# Patient Record
Sex: Male | Born: 1990 | Race: White | Hispanic: No | Marital: Single | Smoking: Current every day smoker
Health system: Southern US, Community
[De-identification: ages and names within clinical notes are randomized; demographics above are authoritative.]

## PROBLEM LIST (undated history)

## (undated) DIAGNOSIS — F419 Anxiety disorder, unspecified: Secondary | ICD-10-CM

## (undated) DIAGNOSIS — M545 Low back pain, unspecified: Secondary | ICD-10-CM

## (undated) DIAGNOSIS — J309 Allergic rhinitis, unspecified: Secondary | ICD-10-CM

## (undated) DIAGNOSIS — E790 Hyperuricemia without signs of inflammatory arthritis and tophaceous disease: Secondary | ICD-10-CM

## (undated) DIAGNOSIS — S93432A Sprain of tibiofibular ligament of left ankle, initial encounter: Secondary | ICD-10-CM

## (undated) DIAGNOSIS — F4310 Post-traumatic stress disorder, unspecified: Secondary | ICD-10-CM

## (undated) DIAGNOSIS — F32A Depression, unspecified: Secondary | ICD-10-CM

## (undated) DIAGNOSIS — F329 Major depressive disorder, single episode, unspecified: Secondary | ICD-10-CM

## (undated) DIAGNOSIS — G40909 Epilepsy, unspecified, not intractable, without status epilepticus: Secondary | ICD-10-CM

## (undated) HISTORY — DX: Allergic rhinitis, unspecified: J30.9

## (undated) HISTORY — PX: FRACTURE SURGERY: SHX138

## (undated) HISTORY — DX: Hyperuricemia without signs of inflammatory arthritis and tophaceous disease: E79.0

## (undated) HISTORY — DX: Anxiety disorder, unspecified: F41.9

## (undated) HISTORY — DX: Post-traumatic stress disorder, unspecified: F43.10

## (undated) HISTORY — DX: Epilepsy, unspecified, not intractable, without status epilepticus: G40.909

## (undated) HISTORY — DX: Depression, unspecified: F32.A

## (undated) HISTORY — DX: Sprain of tibiofibular ligament of left ankle, initial encounter: S93.432A

## (undated) HISTORY — DX: Major depressive disorder, single episode, unspecified: F32.9

## (undated) HISTORY — DX: Low back pain: M54.5

## (undated) HISTORY — DX: Low back pain, unspecified: M54.50

---

## 2011-10-14 DIAGNOSIS — Z8709 Personal history of other diseases of the respiratory system: Secondary | ICD-10-CM | POA: Insufficient documentation

## 2011-10-14 DIAGNOSIS — M545 Low back pain, unspecified: Secondary | ICD-10-CM | POA: Insufficient documentation

## 2011-10-14 DIAGNOSIS — J309 Allergic rhinitis, unspecified: Secondary | ICD-10-CM | POA: Insufficient documentation

## 2014-02-21 ENCOUNTER — Ambulatory Visit (INDEPENDENT_AMBULATORY_CARE_PROVIDER_SITE_OTHER): Payer: BC Managed Care – PPO | Admitting: Psychiatry

## 2014-02-21 ENCOUNTER — Encounter (INDEPENDENT_AMBULATORY_CARE_PROVIDER_SITE_OTHER): Payer: Self-pay

## 2014-02-21 ENCOUNTER — Encounter (HOSPITAL_COMMUNITY): Payer: Self-pay | Admitting: Psychiatry

## 2014-02-21 VITALS — BP 135/81 | HR 87 | Ht 75.0 in | Wt 220.0 lb

## 2014-02-21 DIAGNOSIS — F1920 Other psychoactive substance dependence, uncomplicated: Secondary | ICD-10-CM

## 2014-02-21 DIAGNOSIS — F1111 Opioid abuse, in remission: Secondary | ICD-10-CM | POA: Insufficient documentation

## 2014-02-21 DIAGNOSIS — Z87898 Personal history of other specified conditions: Secondary | ICD-10-CM | POA: Insufficient documentation

## 2014-02-21 DIAGNOSIS — F39 Unspecified mood [affective] disorder: Secondary | ICD-10-CM

## 2014-02-21 MED ORDER — GABAPENTIN 600 MG PO TABS: 600.0000 mg | ORAL_TABLET | Freq: Three times a day (TID) | ORAL | Status: DC

## 2014-02-21 MED ORDER — QUETIAPINE FUMARATE 100 MG PO TABS: 100.0000 mg | ORAL_TABLET | Freq: Every day | ORAL | Status: DC

## 2014-02-21 MED ORDER — BUSPIRONE HCL 15 MG PO TABS: 15.0000 mg | ORAL_TABLET | Freq: Two times a day (BID) | ORAL | Status: DC

## 2014-02-21 MED ORDER — PAROXETINE HCL 20 MG PO TABS: 20.0000 mg | ORAL_TABLET | Freq: Every day | ORAL | Status: DC

## 2014-02-21 NOTE — Progress Notes (Signed)
Patient ID: Ricky Foster, male   DOB: [birth year], 23 y.o.   MRN: [id number]  [facility] Initial Psychiatric Assessment   Ricky Foster [id number] 22 y.o.  02/21/2014 3:18 PM  Chief Complaint:  Drug use including acid and gone through recovery.   History of Present Illness:   Patient Presents for Initial Evaluation with symptoms of recently discharged from a rehabilitation facility in [place]. He has been there  for 17 days prior to that he has been using acid. History of drug use adjust for the last 7 years he has been extensively using drugs more so of marijuana. Last 7 months he got involved in using acid with his girlfriend or fiance. It got to the point that he was using it every day he is to travel 2 hours to get acid and would indulge in use everyda. He started hallucinating. He would get paranoid would have no sleep and no appetite for days. He got withdrawn and lost his job. He also started using Opana and pain medication including hydrocodone that was initially started from his primary care doctor because of his back condition. He was using pain medication along with assets and also infrequently use of cocaine. He started hallucinating and feeling that people talking to him and he was seeing animals assets and also chasing him finally decided to get help the last use a month has been up to 2 g a day.  He got admitted in [place] at the first part rehabilitation facility during the initial phase he had goals including tremor palpitations shaking nausea and paranoia. He also was continued to hallucinate and has been per the medications see chart.  He was in the rehabilitation facility for 10 days he's been doing somewhat better not hallucinating on a day-to-day basis but still endorses paranoia especially at nighttime he does your voices or seeing things but did not command hallucinations.  He also endorses feeling down withdrawn and tired feels the medications are  making him tired and feel hopeless or suicidal or homicidal. He is optimistic to get better and keep on moving forward  There is no associated symptoms of mania including racing thoughts excessive indulgence in activity or elevated mood.  There is no current panic symptoms or excessive worries except that he wants to get better and will forward. Does not endorse suicidal or homicidal thoughts or ideations He is tolerating medication but appears to be more tired and endorses tiredness during the day and more paranoid at nighttime. He is attending NA classes and remains optimistic and regular in his followups.  PTSD Symptoms:  Denies   Past Psychiatric History/Hospitalization(s) Denies prior history or treatment for depression, Bipolar or psychosis. No prior admission.   Hospitalization for psychiatric illness: No History of Electroconvulsive Shock Therapy: No Prior Suicide Attempts: No  Medical History; No past medical history on file. Back Condition.  Degenerative Joint Disease.  Allergies: Allergies not on file NKDA  Medications: Outpatient Encounter Prescriptions as of 02/21/2014  Medication Sig  . [DISCONTINUED] HYDROcodone-acetaminophen (NORCO) 7.5-325 MG per tablet Take 1 tablet by mouth.  . busPIRone (BUSPAR) 15 MG tablet Take 1 tablet (15 mg total) by mouth 2 (two) times daily.  . flunisolide (NASAREL) 29 MCG/ACT (0.025%) nasal spray Place 2 sprays into the nose.  . gabapentin (NEURONTIN) 600 MG tablet Take 1 tablet (600 mg total) by mouth 3 (three) times daily.  . Loratadine 10 MG CAPS Take by mouth.  Marland Kitchen PARoxetine (PAXIL) 20 MG tablet Take 1 tablet (  20 mg total) by mouth daily at 8 pm.  . QUEtiapine (SEROQUEL) 100 MG tablet Take 1 tablet (100 mg total) by mouth at bedtime.  . [DISCONTINUED] busPIRone (BUSPAR) 15 MG tablet Take 15 mg by mouth.  . [DISCONTINUED] gabapentin (NEURONTIN) 600 MG tablet Take 600 mg by mouth.  . [DISCONTINUED] PARoxetine (PAXIL) 10 MG tablet  Take 10 mg by mouth.  . [DISCONTINUED] QUEtiapine (SEROQUEL) 100 MG tablet Take 100 mg by mouth.  . [DISCONTINUED] QUEtiapine (SEROQUEL) 25 MG tablet Take 25 mg by mouth.     Substance Abuse History:   Family History; No family history on file. No Pertinent family psych history.   Biopsychosocial History:  Grew up with his parents. Denies having any, physical abuse. His dad was very tough with him but it down at times. He was active in sports played football but that C. grades did finish high school. 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 There's no legal issues currently is not married and does not have any kids. Lives with his parents    Labs:  No results found for this or any previous visit (from the past 2160 hour(s)).     Musculoskeletal: Strength & Muscle Tone: within normal limits Gait & Station: normal Patient leans: N/A  Mental Status Examination;   Psychiatric Specialty Exam: Physical Exam  Vitals reviewed. Constitutional: He appears well-developed and well-nourished. No distress.    Review of Systems  Constitutional: Negative.   Musculoskeletal: Positive for myalgias.  Psychiatric/Behavioral: Positive for depression, hallucinations and substance abuse. Negative for suicidal ideas and memory loss. The patient is nervous/anxious. The patient does not have insomnia.     Blood pressure 135/81, pulse 87, height 6\' 3"  (1.905 m), weight 220 lb (99.791 kg).Body mass index is 27.5 kg/(m^2).  General Appearance: Casual  Eye Contact::  Fair  Speech:  Slow  Volume:  Decreased  Mood:  Dysphoric  Affect:  Constricted  Thought Process:  Coherent  Orientation:  Full (Time, Place, and Person)  Thought Content:  Hallucinations: Auditory Visual and Rumination  Suicidal Thoughts:  No  Homicidal Thoughts:  No  Memory:  Immediate;   Fair Recent;   Fair Remote;   Fair  Judgement:  Fair  Insight:  Present  Psychomotor Activity:   Normal  Concentration:  Fair  Recall:  Fair  Akathisia:  Negative  Handed:  Right  AIMS (if indicated):     Assets:  Communication Skills Desire for Improvement Financial Resources/Insurance Housing Leisure Time Physical Health Social Support  Sleep:        Assessment: Axis I: Opiate dependence. Polysubstance dependence opiate use disorder moderate t. Crystal meth use disorder . Mood disorder unspecified Axis II:  deferred   Axis III:  No past medical history on file.  Axis IV:  psychosocial, financial    Treatment Plan and Summary:  he is on multiple medications. He's not on any peers now. Does have some symptoms of myalgia and weakness endorses paranoia especially at nighttime. During the day still feels more tired but does not endorse hopelessness. We'll do the following changes increase Paxil to 20 mg and take it at night instead of during the day. Discontinued the 25 mg Seroquel during the day so that he does not feel tired. Cut down buspirone twice a day.  Strongly encouraged to continue therapy in any classes as his attending. He remains optimistic and focus on recovery discussed relapse prevention.   Next visit to  hopefully we will cut down to gabapentin  Pertinent Labs and Relevant Prior Notes reviewed. Medication Side effects, benefits and risks reviewed/discussed with Patient. Time given for patient to respond and asks questions regarding the Diagnosis and Medications. Safety concerns and to report to ER if suicidal or call 911. Relevant Medications refilled or called in to pharmacy. Discussed weight maintenance and Sleep Hygiene. Follow up with Primary care provider in regards to Medical conditions. Recommend compliance with medications and follow up office appointments. Discussed to avail opportunity to consider or/and continue Individual therapy with Counselor. Greater than 50% of time was spend in counseling and coordination of care with the patient.  Schedule  for Follow up visit in 4 weeks or call in earlier as necessary.   [name], Yoshua Geisinger, MD 02/21/2014

## 2014-03-21 ENCOUNTER — Ambulatory Visit (INDEPENDENT_AMBULATORY_CARE_PROVIDER_SITE_OTHER): Payer: BC Managed Care – PPO | Admitting: Psychiatry

## 2014-03-21 VITALS — BP 131/84 | HR 84 | Wt 230.0 lb

## 2014-03-21 DIAGNOSIS — F1920 Other psychoactive substance dependence, uncomplicated: Secondary | ICD-10-CM

## 2014-03-21 DIAGNOSIS — F39 Unspecified mood [affective] disorder: Secondary | ICD-10-CM

## 2014-03-21 MED ORDER — PAROXETINE HCL 20 MG PO TABS: 30.0000 mg | ORAL_TABLET | Freq: Every day | ORAL | Status: DC

## 2014-03-21 MED ORDER — QUETIAPINE FUMARATE 100 MG PO TABS: ORAL_TABLET | ORAL | Status: DC

## 2014-03-21 MED ORDER — GABAPENTIN 600 MG PO TABS: 600.0000 mg | ORAL_TABLET | Freq: Three times a day (TID) | ORAL | Status: DC

## 2014-03-21 MED ORDER — BUSPIRONE HCL 15 MG PO TABS: 15.0000 mg | ORAL_TABLET | Freq: Two times a day (BID) | ORAL | Status: DC

## 2014-03-21 NOTE — Progress Notes (Signed)
Patient ID: Ricky Foster, male   DOB: 09-Jun-1991, 23 y.o.   MRN: [id number] [facility] Follow-up Outpatient Visit  Murphy Duzan [id number] 23 y.o.  03/21/2014  Chief Complaint: Follow up for drug dependence and mood symptoms.      History of Present Illness:   Patient returns for Medication Follow up and is diagnosed with OPiate dependence and unspecified mood disorder. He was seen initially after he has been in a rehabilitation center for drug dependence mostly for opiates, Crystal and marijuana. He was doing reasonable last visit we adjusted some of the medications. Apparently after that visit he had to serve some jail time related to him and his girlfriend and conflict with another person who was giving them money later charged them of identity theft when he did not get the benefit out of from his girl friend. This is the version told by the patient.  In jail his medications were given at night , that everything got messed up and those 10 days were v difficult.  After his release he is feeling low,  he is here feeling tired fatigued depressed. He is constantly thinking about how is he going to deal with legal issues and he has got an attorney. He fortunately has not relapsed. And medications her back to schedule but is still feeling down and worry full disturbed sleep energy, withdrawn but not hopeless or having any suicidal or homicidal thoughts.  He also endorses feeling down withdrawn and tired feels the medications or something is making him tired and feeling overwhelmed. Depression severity. 4/10. 10 being no depression. Timing: more so all day Duration; recently worsen after jail Context: jail stay and drug use in past.  There is no associated symptoms of mania including racing thoughts excessive indulgence in activity or elevated mood.   Does not endorse suicidal or homicidal thoughts or ideations  He is tolerating medication but appears to be more tired  and endorses tiredness during the day and more paranoid at nighttime. He is attending NA classes and remains optimistic and regular in his followups.   Past Medical History  Diagnosis Date  . Low back pain   . Allergic rhinitis    No family history on file.  Outpatient Encounter Prescriptions as of 03/21/2014  Medication Sig  . busPIRone (BUSPAR) 15 MG tablet Take 1 tablet (15 mg total) by mouth 2 (two) times daily.  . flunisolide (NASAREL) 29 MCG/ACT (0.025%) nasal spray Place 2 sprays into the nose.  . gabapentin (NEURONTIN) 600 MG tablet Take 1 tablet (600 mg total) by mouth 3 (three) times daily.  . Loratadine 10 MG CAPS Take by mouth.  Marland Kitchen PARoxetine (PAXIL) 20 MG tablet Take 1.5 tablets (30 mg total) by mouth daily at 8 pm.  . QUEtiapine (SEROQUEL) 100 MG tablet Take half tablet during the day.  night.  . [DISCONTINUED] busPIRone (BUSPAR) 15 MG tablet Take 1 tablet (15 mg total) by mouth 2 (two) times daily.  . [DISCONTINUED] gabapentin (NEURONTIN) 600 MG tablet Take 1 tablet (600 mg total) by mouth 3 (three) times daily.  . [DISCONTINUED] PARoxetine (PAXIL) 20 MG tablet Take 1 tablet (20 mg total) by mouth daily at 8 pm.  . [DISCONTINUED] QUEtiapine (SEROQUEL) 100 MG tablet Take 1 tablet (100 mg total) by mouth at bedtime.    No results found for this or any previous visit (from the past 2160 hour(s)).  BP 131/84  Pulse 84  Wt 230 lb (104.327 kg)   Review of Systems  Musculoskeletal: Positive for myalgias.  Neurological: Negative for dizziness, tremors and headaches.  Psychiatric/Behavioral: Positive for depression. Negative for suicidal ideas, hallucinations and substance abuse. The patient is nervous/anxious.     Mental Status Examination  Appearance: casual Alert: Yes Attention: fair  Cooperative: Yes Eye Contact: Fair Speech: normal tone Psychomotor Activity: Decreased Memory/Concentration: adequate Oriented: place, time/date and situation Mood:  Dysphoric Affect: Congruent Thought Processes and Associations: Coherent Fund of Knowledge: Fair Thought Content: Suicidal ideation and Homicidal ideation were denied. No hallucinations Insight: Fair Judgement: Fair  Diagnosis: Polysubstance dependence opiate dependence. Unspecified episodic mood disorder  Treatment Plan:   Increase Seroquel to 1 mg he can take regarding the day and 100 mg at night for mood stabilization. Increase Paxil to 30 mg because of his depression and having nightmares. He will continued therapy and attended substance abuse classes  Pertinent Labs and Relevant Prior Notes reviewed. Medication Side effects, benefits and risks reviewed/discussed with Patient. Time given for patient to respond and asks questions regarding the Diagnosis and Medications. Safety concerns and to report to ER if suicidal or call 911. Relevant Medications refilled or called in to pharmacy. Discussed weight maintenance and Sleep Hygiene. Follow up with Primary care provider in regards to Medical conditions. Recommend compliance with medications and follow up office appointments. Discussed to avail opportunity to consider or/and continue Individual therapy with Counselor. Greater than 50% of time was spend in counseling and coordination of care with the patient.  Schedule for Follow up visit in 4 weeks or call in earlier as necessary.  Thresa Ross, MD 03/21/2014

## 2014-04-15 ENCOUNTER — Ambulatory Visit (HOSPITAL_COMMUNITY): Payer: BC Managed Care – PPO | Admitting: Psychiatry

## 2014-04-15 ENCOUNTER — Telehealth (HOSPITAL_COMMUNITY): Payer: Self-pay

## 2014-04-15 NOTE — Telephone Encounter (Signed)
Just FYI . Pt has gone back on 3 a day Buspar. Will discuss at next appt.

## 2014-04-28 ENCOUNTER — Other Ambulatory Visit (HOSPITAL_COMMUNITY): Payer: Self-pay | Admitting: Psychiatry

## 2014-04-29 ENCOUNTER — Encounter (INDEPENDENT_AMBULATORY_CARE_PROVIDER_SITE_OTHER): Payer: Self-pay

## 2014-04-29 ENCOUNTER — Encounter (HOSPITAL_COMMUNITY): Payer: Self-pay | Admitting: Psychiatry

## 2014-04-29 ENCOUNTER — Ambulatory Visit (INDEPENDENT_AMBULATORY_CARE_PROVIDER_SITE_OTHER): Payer: BC Managed Care – PPO | Admitting: Psychiatry

## 2014-04-29 VITALS — BP 129/79 | HR 103 | Ht 75.0 in | Wt 232.0 lb

## 2014-04-29 DIAGNOSIS — F1120 Opioid dependence, uncomplicated: Secondary | ICD-10-CM

## 2014-04-29 DIAGNOSIS — F1920 Other psychoactive substance dependence, uncomplicated: Secondary | ICD-10-CM

## 2014-04-29 DIAGNOSIS — F0630 Mood disorder due to known physiological condition, unspecified: Secondary | ICD-10-CM

## 2014-04-29 DIAGNOSIS — F39 Unspecified mood [affective] disorder: Secondary | ICD-10-CM

## 2014-04-29 MED ORDER — GABAPENTIN 600 MG PO TABS: 600.0000 mg | ORAL_TABLET | Freq: Three times a day (TID) | ORAL | Status: DC

## 2014-04-29 MED ORDER — QUETIAPINE FUMARATE 100 MG PO TABS: ORAL_TABLET | ORAL | Status: DC

## 2014-04-29 MED ORDER — BUSPIRONE HCL 15 MG PO TABS: 15.0000 mg | ORAL_TABLET | Freq: Three times a day (TID) | ORAL | Status: DC

## 2014-04-29 MED ORDER — PAROXETINE HCL 20 MG PO TABS: 40.0000 mg | ORAL_TABLET | Freq: Every day | ORAL | Status: DC

## 2014-04-29 NOTE — Progress Notes (Signed)
Patient ID: Ricky Foster, male   DOB: 1991/06/09, 23 y.o.   [id number] [facility]  Ricky Foster [id number] 23 y.o.  04/29/2014  Chief Complaint: Follow up for drug dependence and mood symptoms.      History of Present Illness:   Patient returns for Medication Follow up and is diagnosed with OPiate dependence and unspecified mood disorder. He was seen initially after he has been in a rehabilitation center for drug dependence mostly for opiates, Crystal and marijuana. He was doing reasonable last visit we adjusted some of the medications. Apparently after that visit he had to serve some jail time related to him and his girlfriend and conflict with another person who was giving them money later charged them of identity theft when he did not get the benefit out of from his girl friend. This is the version told by the patient.  Continue to remain anxious, mind races at night. Worries about court date. Has not used but increased buspiron back to tid that has helped.  Has visited ER two times for flue and then seizure . Says now on Keppra and did not have that seizure again. Dreams of using and snores at night.   He is constantly thinking about how is he going to deal with legal issues and he has got an attorney. He fortunately has not relapsed. And medications her back to schedule but is still feeling down and worry full disturbed sleep energy, withdrawn but not hopeless or having any suicidal or homicidal thoughts.  He also endorses feeling down withdrawn and tired feels the medications or something is making him tired and feeling overwhelmed. Depression severity. 4/10. 10 being no depression. Timing: more so all day Duration; recently worsen after jail Context: jail stay and drug use in past.  There is no associated symptoms of mania including racing thoughts excessive indulgence in activity or elevated mood.   Does not endorse suicidal  or homicidal thoughts or ideations  He is tolerating medication but appears to be more tired and endorses tiredness during the day and more paranoid at nighttime. He is attending NA classes and remains optimistic and regular in his followups.   Past Medical History  Diagnosis Date  . Low back pain   . Allergic rhinitis   . Seizure disorder    Family History  Problem Relation Age of Onset  . Depression Maternal Aunt   . Alcohol abuse Maternal Uncle     Outpatient Encounter Prescriptions as of 04/29/2014  Medication Sig  . busPIRone (BUSPAR) 15 MG tablet Take 1 tablet (15 mg total) by mouth 3 (three) times daily.  Marland Kitchen. gabapentin (NEURONTIN) 600 MG tablet Take 1 tablet (600 mg total) by mouth 3 (three) times daily.  Marland Kitchen. levETIRAcetam (KEPPRA) 500 MG tablet Take 500 mg by mouth.  Marland Kitchen. PARoxetine (PAXIL) 20 MG tablet Take 2 tablets (40 mg total) by mouth daily at 8 pm.  . QUEtiapine (SEROQUEL) 100 MG tablet Take half tablet during the day. 100mg  night.  . [DISCONTINUED] busPIRone (BUSPAR) 15 MG tablet Take 1 tablet (15 mg total) by mouth 2 (two) times daily.  . [DISCONTINUED] gabapentin (NEURONTIN) 600 MG tablet Take 1 tablet (600 mg total) by mouth 3 (three) times daily.  . [DISCONTINUED] PARoxetine (PAXIL) 20 MG tablet Take 1.5 tablets (30 mg total) by mouth daily at 8 pm.  . [DISCONTINUED] QUEtiapine (SEROQUEL) 100 MG tablet Take half tablet during the day. 100mg  night.  . flunisolide (NASAREL) 29 MCG/ACT (  0.025%) nasal spray Place 2 sprays into the nose.  . Loratadine 10 MG CAPS Take by mouth.    No results found for this or any previous visit (from the past 2160 hour(s)).  BP 129/79  Pulse 103  Ht 6\' 3"  (1.905 m)  Wt 232 lb (105.235 kg)  BMI 29.00 kg/m2   Review of Systems  Constitutional: Negative for fever.  Gastrointestinal: Negative for nausea.  Musculoskeletal: Positive for myalgias.  Neurological: Negative for dizziness, tremors, focal weakness and headaches.   Psychiatric/Behavioral: Positive for depression. Negative for suicidal ideas, hallucinations and substance abuse. The patient is nervous/anxious.     Mental Status Examination  Appearance: casual Alert: Yes Attention: fair  Cooperative: Yes Eye Contact: Fair Speech: normal tone Psychomotor Activity: Decreased Memory/Concentration: adequate Oriented: place, time/date and situation Mood: Dysphoric Affect: Congruent Thought Processes and Associations: Coherent Fund of Knowledge: Fair Thought Content: Suicidal ideation and Homicidal ideation were denied. No hallucinations Insight: Fair Judgement: Fair  Diagnosis: Polysubstance dependence opiate dependence. Unspecified episodic mood disorder  Treatment Plan:  Increase paxil to 20mg  bid for depression and anxiety. Increase back buspirone 15 tid for anxiety. Continue seroquel and neurontin. Continue NA program. Sleep on side and extra pilllow. Would benefit for sleep study.  Pertinent Labs and Relevant Prior Notes reviewed. Medication Side effects, benefits and risks reviewed/discussed with Patient. Time given for patient to respond and asks questions regarding the Diagnosis and Medications. Safety concerns and to report to ER if suicidal or call 911. Relevant Medications refilled or called in to pharmacy. Discussed weight maintenance and Sleep Hygiene. Follow up with Primary care provider in regards to Medical conditions. Recommend compliance with medications and follow up office appointments. Discussed to avail opportunity to consider or/and continue Individual therapy with Counselor. Greater than 50% of time was spend in counseling and coordination of care with the patient.  Schedule for Follow up visit in 4 weeks or call in earlier as necessary.  Thresa Ross, MD 04/29/2014

## 2014-05-30 ENCOUNTER — Ambulatory Visit (INDEPENDENT_AMBULATORY_CARE_PROVIDER_SITE_OTHER): Payer: BC Managed Care – PPO | Admitting: Psychiatry

## 2014-05-30 ENCOUNTER — Encounter (HOSPITAL_COMMUNITY): Payer: Self-pay | Admitting: Psychiatry

## 2014-05-30 VITALS — HR 88 | Ht 75.0 in | Wt 245.0 lb

## 2014-05-30 DIAGNOSIS — F1920 Other psychoactive substance dependence, uncomplicated: Secondary | ICD-10-CM

## 2014-05-30 DIAGNOSIS — F11288 Opioid dependence with other opioid-induced disorder: Secondary | ICD-10-CM

## 2014-05-30 DIAGNOSIS — F1120 Opioid dependence, uncomplicated: Secondary | ICD-10-CM

## 2014-05-30 DIAGNOSIS — F39 Unspecified mood [affective] disorder: Secondary | ICD-10-CM

## 2014-05-30 DIAGNOSIS — F0630 Mood disorder due to known physiological condition, unspecified: Secondary | ICD-10-CM

## 2014-05-30 MED ORDER — PAROXETINE HCL 20 MG PO TABS: 40.0000 mg | ORAL_TABLET | Freq: Every day | ORAL | Status: DC

## 2014-05-30 MED ORDER — BUSPIRONE HCL 15 MG PO TABS: 15.0000 mg | ORAL_TABLET | Freq: Three times a day (TID) | ORAL | Status: DC

## 2014-05-30 MED ORDER — GABAPENTIN 600 MG PO TABS: 600.0000 mg | ORAL_TABLET | Freq: Three times a day (TID) | ORAL | Status: DC
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
## 2014-06-23 ENCOUNTER — Other Ambulatory Visit (HOSPITAL_COMMUNITY): Payer: Self-pay | Admitting: Psychiatry

## 2014-06-24 ENCOUNTER — Other Ambulatory Visit (HOSPITAL_COMMUNITY): Payer: Self-pay | Admitting: *Deleted

## 2014-06-24 DIAGNOSIS — F0630 Mood disorder due to known physiological condition, unspecified: Secondary | ICD-10-CM

## 2014-06-24 MED ORDER — QUETIAPINE FUMARATE 100 MG PO TABS: ORAL_TABLET | ORAL | Status: DC

## 2014-06-24 NOTE — Telephone Encounter (Addendum)
Pt called requesting refill for Seroquel 100mg . Called pharmacy, authorize refill for Seroquel for 15 days, Qty 23 per Dr. Gilmore LarocheAkhtar.  Informed pt refill is only authorized for 15 days. Pt has an appt on 12/14 with Dr. Gilmore LarocheAkhtar. Pt states and shows understanding.

## 2014-07-04 ENCOUNTER — Ambulatory Visit (INDEPENDENT_AMBULATORY_CARE_PROVIDER_SITE_OTHER): Payer: BC Managed Care – PPO | Admitting: Psychiatry

## 2014-07-04 ENCOUNTER — Encounter (HOSPITAL_COMMUNITY): Payer: Self-pay | Admitting: Psychiatry

## 2014-07-04 VITALS — BP 109/68 | HR 89 | Ht 75.0 in | Wt 255.0 lb

## 2014-07-04 DIAGNOSIS — F1920 Other psychoactive substance dependence, uncomplicated: Secondary | ICD-10-CM

## 2014-07-04 DIAGNOSIS — F11288 Opioid dependence with other opioid-induced disorder: Secondary | ICD-10-CM

## 2014-07-04 DIAGNOSIS — F0630 Mood disorder due to known physiological condition, unspecified: Secondary | ICD-10-CM

## 2014-07-04 MED ORDER — QUETIAPINE FUMARATE 100 MG PO TABS: ORAL_TABLET | ORAL | Status: DC
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
## 2014-07-30 ENCOUNTER — Ambulatory Visit (INDEPENDENT_AMBULATORY_CARE_PROVIDER_SITE_OTHER): Payer: BLUE CROSS/BLUE SHIELD | Admitting: Licensed Clinical Social Worker

## 2014-07-30 DIAGNOSIS — F1520 Other stimulant dependence, uncomplicated: Secondary | ICD-10-CM

## 2014-07-30 DIAGNOSIS — F321 Major depressive disorder, single episode, moderate: Secondary | ICD-10-CM

## 2014-07-30 DIAGNOSIS — F4310 Post-traumatic stress disorder, unspecified: Secondary | ICD-10-CM
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
## 2014-08-06 ENCOUNTER — Ambulatory Visit (INDEPENDENT_AMBULATORY_CARE_PROVIDER_SITE_OTHER): Payer: BLUE CROSS/BLUE SHIELD | Admitting: Psychiatry

## 2014-08-06 ENCOUNTER — Encounter (INDEPENDENT_AMBULATORY_CARE_PROVIDER_SITE_OTHER): Payer: Self-pay

## 2014-08-06 ENCOUNTER — Encounter (HOSPITAL_COMMUNITY): Payer: Self-pay | Admitting: Psychiatry

## 2014-08-06 VITALS — BP 114/79 | HR 96 | Ht 75.0 in | Wt 270.0 lb

## 2014-08-06 DIAGNOSIS — F1920 Other psychoactive substance dependence, uncomplicated: Secondary | ICD-10-CM

## 2014-08-06 DIAGNOSIS — F1120 Opioid dependence, uncomplicated: Secondary | ICD-10-CM

## 2014-08-06 DIAGNOSIS — F321 Major depressive disorder, single episode, moderate: Secondary | ICD-10-CM

## 2014-08-06 DIAGNOSIS — F4310 Post-traumatic stress disorder, unspecified: Secondary | ICD-10-CM

## 2014-08-06 DIAGNOSIS — F11288 Opioid dependence with other opioid-induced disorder: Secondary | ICD-10-CM

## 2014-08-06 DIAGNOSIS — F0630 Mood disorder due to known physiological condition, unspecified: Secondary | ICD-10-CM

## 2014-08-06 DIAGNOSIS — F39 Unspecified mood [affective] disorder: Secondary | ICD-10-CM

## 2014-08-06 MED ORDER — BUSPIRONE HCL 15 MG PO TABS: 15.0000 mg | ORAL_TABLET | Freq: Three times a day (TID) | ORAL | Status: DC

## 2014-08-06 MED ORDER — QUETIAPINE FUMARATE 100 MG PO TABS: ORAL_TABLET | ORAL | Status: DC

## 2014-08-06 MED ORDER — PAROXETINE HCL 20 MG PO TABS: 40.0000 mg | ORAL_TABLET | Freq: Every day | ORAL | Status: DC

## 2014-08-06 NOTE — Progress Notes (Addendum)
Patient ID: Ricky Foster DoctorBrandon Puzzo, male   DOB: 01/23/1991, 24 y.o.   MRN: [id number] [facility]  Ricky Foster DoctorBrandon Ladley [id number] 23 y.o.  08/06/2014  Chief Complaint: Follow up for drug dependence and mood symptoms.      History of Present Illness:   Patient returns for Medication Follow up and is diagnosed with OPiate dependence and unspecified mood disorder.  He was seen initially after he has been in a rehabilitation center for drug dependence mostly for opiates, Crystal and marijuana. He was doing reasonable last visit we adjusted some of the medications. Apparently after that visit he had to serve some jail time related to him and his  X girlfriend and conflict with another person who was giving them money later charged them of identity theft when he did not get the benefit out of from his girl friend. This is the version told by the patient.  Last visit we adjusted seroquel so he can use 50mg  during the day, it did help but felt sleepy. He prefers to take at night..  Still remains anxious at times and also anger. Frustrated easily because of his past and also when he talked about his ex-girlfriend. Other than that he feels the medications helped and he has not relapsed although he still has some craving for drugs. Legal issues remain a big stress related to burglary charges. Says gabapentin not helping the pain. I explained its more so for mood stabilization.  He is going to the gym and try and keep herself physically active but since he has cut down on his issues recommended last visit he didn't sleep better. Continues to take keppra for seizures. Therapy with SA[facility] helping deal with psychosocial and legal issues.   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  He fortunately has not relapsed. And medications her back to schedule but is still feeling down and worry full disturbed sleep energy, withdrawn but  not hopeless or having any suicidal or homicidal thoughts.  He also endorses feeling down withdrawn and tired feels the medications or something is making him tired and feeling overwhelmed. Depression severity. 6/10. 10 being no depression. Timing: more so all day Duration; recently worsen after jail Context: jail stay and drug use in past.  There is no associated symptoms of mania including racing thoughts excessive indulgence in activity or elevated mood.   Does not endorse suicidal or homicidal thoughts or ideations  He is tolerating medication but appears to be more tired and endorses tiredness during the day and more paranoid at nighttime. He is attending NA classes and remains optimistic and regular in his followups.   Past Medical History  Diagnosis Date  . Low back pain   . Allergic rhinitis   . Seizure disorder    Family History  Problem Relation Age of Onset  . Depression Maternal Aunt   . Alcohol abuse Maternal Uncle     Outpatient Encounter Prescriptions as of 08/06/2014  Medication Sig  . busPIRone (BUSPAR) 15 MG tablet Take 1 tablet (15 mg total) by mouth 3 (three) times daily.  . flunisolide (NASAREL) 29 MCG/ACT (0.025%) nasal spray Place 2 sprays into the nose.  . gabapentin (NEURONTIN) 600 MG tablet Take 1 tablet (600 mg total) by mouth 3 (three) times daily.  Marland Kitchen. levETIRAcetam (KEPPRA) 500 MG tablet Take 500 mg by mouth.  . Loratadine 10 MG CAPS Take by mouth.  Marland Kitchen. PARoxetine (PAXIL)  20 MG tablet Take 2 tablets (40 mg total) by mouth daily at 8 pm.  . QUEtiapine (SEROQUEL) 100 MG tablet  night.  . [DISCONTINUED] busPIRone (BUSPAR) 15 MG tablet Take 1 tablet (15 mg total) by mouth 3 (three) times daily.  . [DISCONTINUED] PARoxetine (PAXIL) 20 MG tablet Take 2 tablets (40 mg total) by mouth daily at 8 pm.  . [DISCONTINUED] QUEtiapine (SEROQUEL) 100 MG tablet Take half tablet during the day.  night.    No results found for this or any previous visit (from  the past 2160 hour(s)).  BP 114/79 mmHg  Pulse 96  Ht  (1.905 m)  Wt 270 lb (122.471 kg)  BMI 33.75 kg/m2   Review of Systems  Constitutional: Negative for chills.  Respiratory: Negative for cough.   Cardiovascular: Negative for chest pain.  Gastrointestinal: Negative for heartburn.  Musculoskeletal: Positive for myalgias.  Neurological: Negative for tremors and headaches.  Psychiatric/Behavioral: Positive for depression. Negative for suicidal ideas, hallucinations and substance abuse. The patient is nervous/anxious.     Mental Status Examination  Appearance: casual Alert: Yes Attention: fair  Cooperative: Yes Eye Contact: Fair Speech: normal tone Psychomotor Activity: Decreased Memory/Concentration: adequate Oriented: place, time/date and situation Mood: Dysphoric Affect: Congruent Thought Processes and Associations: Coherent Fund of Knowledge: Fair Thought Content: Suicidal ideation and Homicidal ideation were denied. No hallucinations Insight: Fair Judgement: Fair  Diagnosis: Polysubstance dependence opiate dependence. Unspecified episodic mood disorder  Treatment Plan:  Continue paxil, seroquel, buspirone. Says buspirone helps anixety.  He has gabapentin. No Involuntary movements.  Continue NA program. Sleep on side and extra pilllow. Would benefit for sleep study . Lower Seroquel to  at night only. Reviewed sleep hygiene.   Continue therapy with Maralyn Sago that has helped Pertinent Labs and Relevant Prior Notes reviewed. Medication Side effects, benefits and risks reviewed/discussed with Patient. Time given for patient to respond and asks questions regarding the Diagnosis and Medications. Safety concerns and to report to ER if suicidal or call 911. Relevant Medications refilled or called in to pharmacy. Discussed weight maintenance. He continues to go Gym  Follow up with Primary care provider in regards to Medical conditions. Recommend compliance with  medications and follow up office appointments. Discussed to avail opportunity to consider or/and continue Individual therapy with Counselor. Greater than 50% of time was spend in counseling and coordination of care with the patient.  Schedule for Follow up visit in  6  weeks or call in earlier as necessary.  Thresa Ross, MD 08/06/2014

## 2014-08-09 ENCOUNTER — Encounter (INDEPENDENT_AMBULATORY_CARE_PROVIDER_SITE_OTHER): Payer: Self-pay

## 2014-08-09 ENCOUNTER — Ambulatory Visit (INDEPENDENT_AMBULATORY_CARE_PROVIDER_SITE_OTHER): Payer: BLUE CROSS/BLUE SHIELD | Admitting: Licensed Clinical Social Worker

## 2014-08-09 DIAGNOSIS — F4310 Post-traumatic stress disorder, unspecified: Secondary | ICD-10-CM

## 2014-08-09 NOTE — Psych (Signed)
   THERAPIST PROGRESS NOTE  Session Time: 9:00am-10:00am  Participation Level: Active  Behavioral Response: CasualAlertAnxious  Type of Therapy: Individual Therapy  Treatment Goals addressed: Coping  Interventions: Other: psychoeducation about diagnosis  Therapist Response:  Gathered information about significant events and changes in mood and functioning.  Introduced the topic of trauma and how it can affect people.  Emphasized that it is normal to have anxiety following a traumatic event.  Provided patient with a workbook chapter to read called "Understanding Trauma."      Summary:   Reports he has been isolating a lot, swimming in his own thoughts, having urges to use, and feeling depressed.  Reports he ended his relationship with his girlfiriend because she was too clingy, talked too much about drugs, and put him down quite a bit.  He was also contacted by his ex.  This triggered more depression.  Has learned that he will have to go to [employer] for his [facility] issues.    Acknowledged he has experienced a number of traumatic events throughout his life.  He opened up about some of these events.  He agreed to read the workbook chapter.        Suicidal/Homicidal: Denied both    Plan: Return again in two weeks.  Will continue psycho-education about PTSD    Diagnosis: Post Traumatic Stress Disorder        Marilu FavreSolomon, Sarah A, LCSW 08/09/2014

## 2014-08-15 ENCOUNTER — Other Ambulatory Visit (HOSPITAL_COMMUNITY): Payer: Self-pay | Admitting: *Deleted

## 2014-08-15 DIAGNOSIS — F321 Major depressive disorder, single episode, moderate: Secondary | ICD-10-CM

## 2014-08-15 DIAGNOSIS — F4310 Post-traumatic stress disorder, unspecified: Secondary | ICD-10-CM

## 2014-08-15 MED ORDER — GABAPENTIN 600 MG PO TABS: 600.0000 mg | ORAL_TABLET | Freq: Three times a day (TID) | ORAL | Status: DC

## 2014-08-15 NOTE — Telephone Encounter (Signed)
Pt called stating he will be out of Gabapentin on Sunday and will need a prescription. Per Dr. Gilmore LarocheAkhtar, pt is authorized for 1 refill for Gabapentin 600mg , Qty 90. Pt has f/u appt on 3/10. Called and informed pt, [facility]. Pt states and shows understanding.

## 2014-08-16 ENCOUNTER — Ambulatory Visit (HOSPITAL_COMMUNITY): Payer: BLUE CROSS/BLUE SHIELD | Admitting: Licensed Clinical Social Worker

## 2014-08-23 ENCOUNTER — Ambulatory Visit (INDEPENDENT_AMBULATORY_CARE_PROVIDER_SITE_OTHER): Payer: BLUE CROSS/BLUE SHIELD | Admitting: Licensed Clinical Social Worker

## 2014-08-23 ENCOUNTER — Encounter (INDEPENDENT_AMBULATORY_CARE_PROVIDER_SITE_OTHER): Payer: Self-pay

## 2014-08-23 DIAGNOSIS — F0630 Mood disorder due to known physiological condition, unspecified: Secondary | ICD-10-CM | POA: Insufficient documentation

## 2014-08-23 DIAGNOSIS — F319 Bipolar disorder, unspecified: Secondary | ICD-10-CM

## 2014-08-23 DIAGNOSIS — F3130 Bipolar disorder, current episode depressed, mild or moderate severity, unspecified: Secondary | ICD-10-CM

## 2014-08-23 DIAGNOSIS — F4310 Post-traumatic stress disorder, unspecified: Secondary | ICD-10-CM | POA: Insufficient documentation

## 2014-08-23 NOTE — Psych (Signed)
   THERAPIST PROGRESS NOTE  Session Time: 9:50am-10:50am  Participation Level: Active  Behavioral Response: CasualAlertAnxious  Type of Therapy: Individual Therapy  Treatment Goals addressed: Coping  Interventions: Other: psychoeducation about diagnosis  Therapist Interventions: Assessed for bipolar disorder using Foster Mood Disorders Questionnaire and Foster Bipolar Spectrum Diagnostic Scale.  Interpreted results.  Educated patient about bipolar disorder.  Explained symptoms associated with Foster manic/hypomanic episode as well as for Foster depressive episode.  Encouraged patient to share information about bipolar with his family.      Summary:   Reported that he did read the workbook chapter "Understanding Trauma."  Noted that he found it helpful.   Assessment results indicated that he does meet criteria for Foster bipolar disorder.  He provided many examples of ways he fit the description of the symptoms.  Shared that there have been several people in his life who have expressed concern about his extreme moods.      Suicidal/Homicidal: Denied both    Plan: Return again in one week.      Diagnosis: Bipolar Disorder        Ricky Foster, Ricky Malan A, LCSW 08/23/2014

## 2014-08-30 ENCOUNTER — Encounter (INDEPENDENT_AMBULATORY_CARE_PROVIDER_SITE_OTHER): Payer: Self-pay

## 2014-08-30 ENCOUNTER — Ambulatory Visit (INDEPENDENT_AMBULATORY_CARE_PROVIDER_SITE_OTHER): Payer: BLUE CROSS/BLUE SHIELD | Admitting: Licensed Clinical Social Worker

## 2014-08-30 DIAGNOSIS — F4310 Post-traumatic stress disorder, unspecified: Secondary | ICD-10-CM

## 2014-08-30 NOTE — Psych (Signed)
   THERAPIST PROGRESS NOTE  Session Time: 12:30pm-1:30pm  Participation Level: Active  Behavioral Response: CasualAlertAnxious  Type of Therapy: Individual Therapy  Treatment Goals addressed: Coping  Interventions: CBT  Therapist Interventions:   Provided positive feedback regarding how patient is coping with homicidal thoughts.   Explained that usually in order to reduce anxiety you have to expose yourself to what you fear.  Emphasized you may have to do this in a gradual way. Provided patient with ideas of coping statements he can use in situations where he recognizes he is feeling anxious.        Summary:    Shared thoughts and feelings about his court case.  Admitted to struggling at times with thoughts of wanting to harm the man pressing charges, but explained that he has been honest with his parents about these thoughts and they have been supportive.     Indicated he is developing a greater awareness of his triggers for anxiety.   Indicated that he intends to challenge himself by putting himself in situations that are likely to trigger some anxiety and use the coping statements discussed today.     Suicidal/Homicidal: Admitted to some HI without intent, denies SI    Plan: Return again in one week.      May introduce mindfulness.    Diagnosis: PTSD        Darrin LuisSolomon, Morell Mears A, LCSW 08/30/2014

## 2014-09-06 ENCOUNTER — Ambulatory Visit (HOSPITAL_COMMUNITY): Payer: BLUE CROSS/BLUE SHIELD | Admitting: Licensed Clinical Social Worker

## 2014-09-13 ENCOUNTER — Ambulatory Visit (HOSPITAL_COMMUNITY): Payer: BLUE CROSS/BLUE SHIELD | Admitting: Licensed Clinical Social Worker

## 2014-09-17 ENCOUNTER — Other Ambulatory Visit (HOSPITAL_COMMUNITY): Payer: Self-pay | Admitting: *Deleted

## 2014-09-17 DIAGNOSIS — F321 Major depressive disorder, single episode, moderate: Secondary | ICD-10-CM

## 2014-09-17 DIAGNOSIS — F4310 Post-traumatic stress disorder, unspecified: Secondary | ICD-10-CM

## 2014-09-17 MED ORDER — GABAPENTIN 600 MG PO TABS: 600.0000 mg | ORAL_TABLET | Freq: Three times a day (TID) | ORAL | Status: DC

## 2014-09-17 NOTE — Telephone Encounter (Signed)
Pt called for a refill for Gabapentin 600mg . Per Dr. Gilmore LarocheAkhtar, pt is authorized for a refill for Gabapentin 600mg , Qty 90. Pt is schedule for a f/u appt on 3/10. Pt verbally states understanding.

## 2014-09-20 ENCOUNTER — Ambulatory Visit (HOSPITAL_COMMUNITY): Payer: BLUE CROSS/BLUE SHIELD | Admitting: Licensed Clinical Social Worker

## 2014-10-01 ENCOUNTER — Encounter (HOSPITAL_COMMUNITY): Payer: Self-pay | Admitting: Psychiatry

## 2014-10-01 ENCOUNTER — Encounter (INDEPENDENT_AMBULATORY_CARE_PROVIDER_SITE_OTHER): Payer: Self-pay

## 2014-10-01 ENCOUNTER — Ambulatory Visit (INDEPENDENT_AMBULATORY_CARE_PROVIDER_SITE_OTHER): Payer: BLUE CROSS/BLUE SHIELD | Admitting: Psychiatry

## 2014-10-01 VITALS — BP 130/80 | HR 84 | Ht 75.0 in | Wt 260.0 lb

## 2014-10-01 DIAGNOSIS — F1120 Opioid dependence, uncomplicated: Secondary | ICD-10-CM | POA: Diagnosis not present

## 2014-10-01 DIAGNOSIS — F4310 Post-traumatic stress disorder, unspecified: Secondary | ICD-10-CM | POA: Diagnosis not present

## 2014-10-01 DIAGNOSIS — F0630 Mood disorder due to known physiological condition, unspecified: Secondary | ICD-10-CM

## 2014-10-01 DIAGNOSIS — F321 Major depressive disorder, single episode, moderate: Secondary | ICD-10-CM

## 2014-10-01 DIAGNOSIS — F39 Unspecified mood [affective] disorder: Secondary | ICD-10-CM | POA: Diagnosis not present

## 2014-10-01 DIAGNOSIS — F1920 Other psychoactive substance dependence, uncomplicated: Secondary | ICD-10-CM

## 2014-10-01 MED ORDER — GABAPENTIN 600 MG PO TABS: 600.0000 mg | ORAL_TABLET | Freq: Three times a day (TID) | ORAL | Status: DC

## 2014-10-01 MED ORDER — PAROXETINE HCL 20 MG PO TABS: 40.0000 mg | ORAL_TABLET | Freq: Every day | ORAL | Status: DC

## 2014-10-01 MED ORDER — QUETIAPINE FUMARATE 100 MG PO TABS: ORAL_TABLET | ORAL | Status: DC

## 2014-10-01 NOTE — Progress Notes (Signed)
Patient ID: Ricky Foster DoctorBrandon Hegg, male   DOB: [birth year], 24 y.o.   MRN: [id number] [facility]  Ricky Foster DoctorBrandon Lannom [id number] 23 y.o.  10/01/2014  Chief Complaint: Follow up for drug dependence and mood symptoms.      History of Present Illness:   Patient returns for Medication Follow up and is diagnosed with OPiate dependence and unspecified mood disorder.  He was seen initially after he has been in a rehabilitation center for drug dependence mostly for opiates, Crystal and marijuana. He was doing reasonable last visit we adjusted some of the medications. Apparently after that visit he had to serve some jail time related to him and his  X girlfriend and conflict with another person who was giving them money later charged them of identity theft when he did not get the benefit out of from his girl friend. This is the version told by the patient.  Patient is currently working endorses less moodiness. No relapse back on drugs. Court case is going on well and is not disturbing him too much. He is also using a C-Pap machine. She endorses anxiety says that buspirone does not help and anxiety and worries make him upset and sometimes affects his mood. Otherwise denies depression or hopelessness. He is going to the gym and try and keep herself physically active but since he has cut down on his issues recommended last visit he didn't sleep better. Continues to take keppra for seizures. Therapy with SA[facility] helping deal with psychosocial and legal issues.   Depression severity. 6/10. 10 being no depression. Timing: more so all day Duration; recently worsen after jail Context: jail stay and drug use in past.  There is no associated symptoms of mania including racing thoughts excessive indulgence in activity or elevated mood.   Does not endorse suicidal or homicidal thoughts or ideations  He is tolerating medication but appears to be more tired and endorses  tiredness during the day and more paranoid at nighttime. He is attending NA classes and remains optimistic and regular in his followups.   Past Medical History  Diagnosis Date  . Low back pain   . Allergic rhinitis   . Seizure disorder    Family History  Problem Relation Age of Onset  . Depression Maternal Aunt   . Alcohol abuse Maternal Uncle     Outpatient Encounter Prescriptions as of 10/01/2014  Medication Sig  . busPIRone (BUSPAR) 15 MG tablet Take 1 tablet (15 mg total) by mouth 3 (three) times daily.  . flunisolide (NASAREL) 29 MCG/ACT (0.025%) nasal spray Place 2 sprays into the nose.  . gabapentin (NEURONTIN) 600 MG tablet Take 1 tablet (600 mg total) by mouth 3 (three) times daily.  Marland Kitchen. levETIRAcetam (KEPPRA) 500 MG tablet Take 500 mg by mouth.  . Loratadine 10 MG CAPS Take by mouth.  Marland Kitchen. PARoxetine (PAXIL) 20 MG tablet Take 2 tablets (40 mg total) by mouth daily at 8 pm.  . QUEtiapine (SEROQUEL) 100 MG tablet Take one and one half tablet. Total of 150mg  at night  . [DISCONTINUED] gabapentin (NEURONTIN) 600 MG tablet Take 1 tablet (600 mg total) by mouth 3 (three) times daily.  . [DISCONTINUED] PARoxetine (PAXIL) 20 MG tablet Take 2 tablets (40 mg total) by mouth daily at 8 pm.  . [DISCONTINUED] QUEtiapine (SEROQUEL) 100 MG tablet 100mg  night.    No results found for this or any previous visit (from the past 2160 hour(s)).  BP 130/80 mmHg  Pulse 84  Ht 6'  3" (1.905 m)  Wt 260 lb (117.935 kg)  BMI 32.50 kg/m2   ROS  Mental Status Examination  Appearance: casual Alert: Yes Attention: fair  Cooperative: Yes Eye Contact: Fair Speech: normal tone Psychomotor Activity: Decreased Memory/Concentration: adequate Oriented: place, time/date and situation Mood: more reactive mood. Less dysphoric Affect: Congruent Thought Processes and Associations: Coherent Fund of Knowledge: Fair Thought Content: Suicidal ideation and Homicidal ideation were denied. No  hallucinations Insight: Fair Judgement: Fair  Diagnosis: Polysubstance dependence opiate dependence. Unspecified episodic mood disorder. PTSD  Treatment Plan:  Continue paxil, seroquel, buspirone. Says buspirone helps anixety but not enough.  He has gabapentin. No Involuntary movements. AIMS is zero. CPap helps at night.  Increase  Seroquel to  at night for mood stabilization and augmentation for anixety meds. Reviewed sleep hygiene.   Continue therapy with Maralyn Sago that has helped Pertinent Labs and Relevant Prior Notes reviewed. Medication Side effects, benefits and risks reviewed/discussed with Patient. Time given for patient to respond and asks questions regarding the Diagnosis and Medications. Safety concerns and to report to ER if suicidal or call 911. Relevant Medications refilled or called in to pharmacy. Discussed weight maintenance. He continues to go Gym  Follow up with Primary care provider in regards to Medical conditions. Recommend compliance with medications and follow up office appointments. Discussed to avail opportunity to consider or/and continue Individual therapy with Counselor. Greater than 50% of time was spend in counseling and coordination of care with the patient.  Schedule for Follow up visit in  6  weeks or call in earlier as necessary.  Thresa Ross, MD 10/01/2014

## 2014-10-03 ENCOUNTER — Ambulatory Visit (HOSPITAL_COMMUNITY): Payer: BLUE CROSS/BLUE SHIELD | Admitting: Psychiatry

## 2014-10-09 ENCOUNTER — Telehealth (HOSPITAL_COMMUNITY): Payer: Self-pay | Admitting: *Deleted

## 2014-10-09 DIAGNOSIS — F4310 Post-traumatic stress disorder, unspecified: Secondary | ICD-10-CM

## 2014-10-09 DIAGNOSIS — F0630 Mood disorder due to known physiological condition, unspecified: Secondary | ICD-10-CM

## 2014-10-09 MED ORDER — BUSPIRONE HCL 15 MG PO TABS: 15.0000 mg | ORAL_TABLET | Freq: Three times a day (TID) | ORAL | Status: DC

## 2014-10-09 NOTE — Telephone Encounter (Signed)
Received called from pt stating he needs a refill for Buspar. Per Dr. Gilmore LarocheAkhtar, pt is authorized for a refill for Buspar 15mg , Qty 90. Prescription was sent to pharmacy. Pt has f/u appt on 4/8. Pt states and shows understanding.

## 2014-10-10 ENCOUNTER — Ambulatory Visit (HOSPITAL_COMMUNITY): Payer: BLUE CROSS/BLUE SHIELD | Admitting: Psychiatry

## 2014-10-15 ENCOUNTER — Ambulatory Visit (HOSPITAL_COMMUNITY): Payer: BLUE CROSS/BLUE SHIELD | Admitting: Psychiatry

## 2014-11-01 ENCOUNTER — Ambulatory Visit (HOSPITAL_COMMUNITY): Payer: BLUE CROSS/BLUE SHIELD | Admitting: Psychiatry

## 2014-11-18 ENCOUNTER — Encounter (HOSPITAL_COMMUNITY): Payer: Self-pay | Admitting: Psychiatry

## 2014-11-18 ENCOUNTER — Ambulatory Visit (INDEPENDENT_AMBULATORY_CARE_PROVIDER_SITE_OTHER): Payer: BLUE CROSS/BLUE SHIELD | Admitting: Psychiatry

## 2014-11-18 VITALS — BP 120/62 | HR 80 | Ht 75.0 in | Wt 265.0 lb

## 2014-11-18 DIAGNOSIS — F1120 Opioid dependence, uncomplicated: Secondary | ICD-10-CM | POA: Diagnosis not present

## 2014-11-18 DIAGNOSIS — F39 Unspecified mood [affective] disorder: Secondary | ICD-10-CM

## 2014-11-18 DIAGNOSIS — F1920 Other psychoactive substance dependence, uncomplicated: Secondary | ICD-10-CM | POA: Diagnosis not present

## 2014-11-18 DIAGNOSIS — F4310 Post-traumatic stress disorder, unspecified: Secondary | ICD-10-CM

## 2014-11-18 DIAGNOSIS — F11288 Opioid dependence with other opioid-induced disorder: Secondary | ICD-10-CM

## 2014-11-18 DIAGNOSIS — F0630 Mood disorder due to known physiological condition, unspecified: Secondary | ICD-10-CM

## 2014-11-18 DIAGNOSIS — F319 Bipolar disorder, unspecified: Secondary | ICD-10-CM

## 2014-11-18 NOTE — Progress Notes (Signed)
Patient ID: Guinn Delarosa, male   DOB: [birth year], 24 y.o.   MRN: [id number] [facility] Follow-up Outpatient Visit  Jakaree Pickard [id number] 23 y.o.  11/18/2014  Chief Complaint: Follow up for drug dependence and mood symptoms.      History of Present Illness:   Patient returns for Medication Follow up and is diagnosed with OPiate dependence and unspecified mood disorder.  He was seen initially after he has been in a rehabilitation center for drug dependence mostly for opiates, Crystal and marijuana. He was doing reasonable last visit we adjusted some of the medications. Apparently after that visit he had to serve some jail time related to him and his  X girlfriend and conflict with another person who was giving them money later charged them of identity theft when he did not get the benefit out of from his girl friend. This is the version told by the patient.  Patient is currently working endorses less moodiness. No relapse back on drugs. Court case is going on well and is not disturbing him too much. He is also using a C-Pap machine. She endorses anxiety says that buspirone does not help and anxiety and worries make him upset and sometimes affects his mood. Otherwise denies depression or hopelessness.  Sleep and moodiness improved since increase of seroquel last vist.  He has some pain and numbness and rises to visit urgent care didn't increase the gabapentin to 2 tablets of 600 mg 3 times a day. I cautioned that I cannot give that much higher dose and he needs to follow with the primary care physician and he agrees with the plan. He still has some nightmares about using drugs but overall has remained sober for 10 months. Court case is going slow but his lawyer has mentioned that he may not get the jail times so he is  somewhat relieved He is going to the gym and try and keep herself physically active. Continues to take keppra for seizures. Says does not have therapy  time as he works.  Depression severity. 6/10. 10 being no depression. Timing: more so all day Duration; recently worsen after jail Context: jail stay and drug use in past.  There is no associated symptoms of mania including racing thoughts excessive indulgence in activity or elevated mood.   Does not endorse suicidal or homicidal thoughts or ideations  He is tolerating medication but appears to be more tired and endorses tiredness during the day and more paranoid at nighttime. He is attending NA classes and remains optimistic and regular in his followups.   Past Medical History  Diagnosis Date  . Low back pain   . Allergic rhinitis   . Seizure disorder    Family History  Problem Relation Age of Onset  . Depression Maternal Aunt   . Alcohol abuse Maternal Uncle     Outpatient Encounter Prescriptions as of 11/18/2014  Medication Sig  . busPIRone (BUSPAR) 15 MG tablet Take 1 tablet (15 mg total) by mouth 3 (three) times daily.  . flunisolide (NASAREL) 29 MCG/ACT (0.025%) nasal spray Place 2 sprays into the nose.  . gabapentin (NEURONTIN) 600 MG tablet Take 1 tablet (600 mg total) by mouth 3 (three) times daily.  Marland Kitchen levETIRAcetam (KEPPRA) 500 MG tablet Take 500 mg by mouth.  . Loratadine 10 MG CAPS Take by mouth.  Marland Kitchen PARoxetine (PAXIL) 20 MG tablet Take 2 tablets (40 mg total) by mouth daily at 8 pm.  . QUEtiapine (SEROQUEL) 100 MG tablet Take one  and one half tablet. Total of 150mg  at night    No results found for this or any previous visit (from the past 2160 hour(s)).  BP 120/62 mmHg  Pulse 80  Ht 6\' 3"  (1.905 m)  Wt 265 lb (120.203 kg)  BMI 33.12 kg/m2   Review of Systems  Cardiovascular: Negative for chest pain.  Musculoskeletal: Positive for myalgias.  Skin: Negative for rash.  Neurological: Negative for tremors.  Psychiatric/Behavioral: Negative for suicidal ideas and substance abuse.    Mental Status Examination  Appearance: casual Alert: Yes Attention: fair   Cooperative: Yes Eye Contact: Fair Speech: normal tone Psychomotor Activity: Decreased Memory/Concentration: adequate Oriented: place, time/date and situation Mood: more reactive mood. Less dysphoric Affect: Congruent Thought Processes and Associations: Coherent Fund of Knowledge: Fair Thought Content: Suicidal ideation and Homicidal ideation were denied. No hallucinations Insight: Fair Judgement: Fair  Diagnosis: Polysubstance dependence opiate dependence. Unspecified episodic mood disorder. PTSD  Treatment Plan:  Continue paxil, seroquel, buspirone. Says buspirone helps anixety but not enough.  He has gabapentin. No Involuntary movements. AIMS is zero. CPap helps at night.  He continues to take seroquel 150mg  . Says has enough tablets. Advised to follow up with primary care if he continues high dose of neurontin. For now he has meds. Reviewed sleep hygiene.   Continue therapy with Maralyn SagoSArah that has helped Pertinent Labs and Relevant Prior Notes reviewed. Medication Side effects, benefits and risks reviewed/discussed with Patient. Time given for patient to respond and asks questions regarding the Diagnosis and Medications. Safety concerns and to report to ER if suicidal or call 911. Relevant Medications refilled or called in to pharmacy. Discussed weight maintenance. He continues to go Gym  Follow up with Primary care provider in regards to Medical conditions. Recommend compliance with medications and follow up office appointments. Discussed to avail opportunity to consider or/and continue Individual therapy with Counselor. Greater than 50% of time was spend in counseling and coordination of care with the patient.  Schedule for Follow up visit in  6  weeks or call in earlier as necessary.  Time spent: 25 minute.  [name], Michaelah Credeur, MD 11/18/2014

## 2014-11-21 ENCOUNTER — Telehealth (HOSPITAL_COMMUNITY): Payer: Self-pay | Admitting: *Deleted

## 2014-11-21 DIAGNOSIS — F0630 Mood disorder due to known physiological condition, unspecified: Secondary | ICD-10-CM

## 2014-11-21 DIAGNOSIS — F4310 Post-traumatic stress disorder, unspecified: Secondary | ICD-10-CM

## 2014-11-21 MED ORDER — BUSPIRONE HCL 15 MG PO TABS: 15.0000 mg | ORAL_TABLET | Freq: Three times a day (TID) | ORAL | Status: DC

## 2014-11-21 MED ORDER — PAROXETINE HCL 20 MG PO TABS: 40.0000 mg | ORAL_TABLET | Freq: Every day | ORAL | Status: DC

## 2014-11-21 NOTE — Telephone Encounter (Signed)
Pt called for a refill for Buspar 15mg  and Paxil 20mg . Per Dr. Gilmore LarocheAkhtar, pt is authorized for a refill for Buspar 15mg , Qty 90 and Paxil 20mg , Qty 60. Prescriptions were sent to pharmacy. Called and informed pt of prescriptions status.  Pt states and shows understanding. Pt has a f/u appt on 5/25.

## 2014-11-25 ENCOUNTER — Other Ambulatory Visit (HOSPITAL_COMMUNITY): Payer: Self-pay | Admitting: Psychiatry

## 2014-12-03 NOTE — Telephone Encounter (Signed)
[facility] for Paxil 20mg . Per Dr. Gilmore LarocheAkhtar, pt is authorized for a refill for Paxil 20mg , qty 60. Prescription was sent to pharmacy. Pt has a f/u appt on 5/25. Called and informed pt of prescription status. Pt states and shows understanding.

## 2014-12-12 ENCOUNTER — Telehealth (HOSPITAL_COMMUNITY): Payer: Self-pay | Admitting: *Deleted

## 2014-12-12 DIAGNOSIS — F0630 Mood disorder due to known physiological condition, unspecified: Secondary | ICD-10-CM

## 2014-12-12 MED ORDER — QUETIAPINE FUMARATE 100 MG PO TABS: ORAL_TABLET | ORAL | Status: DC

## 2014-12-12 NOTE — Telephone Encounter (Signed)
Pt called for a refill for Seroquel 100mg . Per Dr. Gilmore LarocheAkhtar, pt is authorized for a refill for Seroquel 100mg , Qty 45. [facility]. Pt has a follow up appt on 5/25. Called and informed pt of prescription status. Pt states and shows understanding.

## 2014-12-18 ENCOUNTER — Ambulatory Visit (HOSPITAL_COMMUNITY): Payer: Self-pay | Admitting: Psychiatry

## 2014-12-24 ENCOUNTER — Ambulatory Visit (HOSPITAL_COMMUNITY): Payer: Self-pay | Admitting: Psychiatry

## 2015-01-06 ENCOUNTER — Encounter (HOSPITAL_COMMUNITY): Payer: Self-pay | Admitting: Psychiatry

## 2015-01-06 ENCOUNTER — Ambulatory Visit (INDEPENDENT_AMBULATORY_CARE_PROVIDER_SITE_OTHER): Payer: BLUE CROSS/BLUE SHIELD | Admitting: Psychiatry

## 2015-01-06 VITALS — BP 124/62 | HR 81 | Ht 75.0 in | Wt 270.0 lb

## 2015-01-06 DIAGNOSIS — F0630 Mood disorder due to known physiological condition, unspecified: Secondary | ICD-10-CM

## 2015-01-06 DIAGNOSIS — F39 Unspecified mood [affective] disorder: Secondary | ICD-10-CM

## 2015-01-06 DIAGNOSIS — F1920 Other psychoactive substance dependence, uncomplicated: Secondary | ICD-10-CM

## 2015-01-06 DIAGNOSIS — F329 Major depressive disorder, single episode, unspecified: Secondary | ICD-10-CM

## 2015-01-06 DIAGNOSIS — F321 Major depressive disorder, single episode, moderate: Secondary | ICD-10-CM

## 2015-01-06 DIAGNOSIS — F11288 Opioid dependence with other opioid-induced disorder: Secondary | ICD-10-CM

## 2015-01-06 DIAGNOSIS — F1120 Opioid dependence, uncomplicated: Secondary | ICD-10-CM | POA: Diagnosis not present

## 2015-01-06 DIAGNOSIS — F411 Generalized anxiety disorder: Secondary | ICD-10-CM

## 2015-01-06 DIAGNOSIS — F4310 Post-traumatic stress disorder, unspecified: Secondary | ICD-10-CM

## 2015-01-06 MED ORDER — GABAPENTIN 600 MG PO TABS: 600.0000 mg | ORAL_TABLET | Freq: Two times a day (BID) | ORAL | Status: DC

## 2015-01-06 MED ORDER — QUETIAPINE FUMARATE 100 MG PO TABS: ORAL_TABLET | ORAL | Status: DC

## 2015-01-06 MED ORDER — PAROXETINE HCL 20 MG PO TABS: ORAL_TABLET | ORAL | Status: DC

## 2015-01-06 MED ORDER — BUSPIRONE HCL 15 MG PO TABS: 15.0000 mg | ORAL_TABLET | Freq: Every day | ORAL | Status: DC

## 2015-01-06 NOTE — Progress Notes (Signed)
Patient ID: Ricky Foster, male   DOB: Dec 28, 1990, 24 y.o.   MRN: [id number] [facility] Follow-up Outpatient Visit  Ricky Foster [id number] 24 y.o.  01/06/2015  Chief Complaint: Follow up for drug dependence and mood symptoms.      History of Present Illness:   Patient returns for Medication Follow up and is diagnosed with OPiate dependence and unspecified mood disorder. Major depressive disorder. Anxiety  He was seen initially after he has been in a rehabilitation center for drug dependence mostly for opiates, Crystal and marijuana. He was doing reasonable last visit we adjusted some of the medications. Apparently after that visit he had to serve some jail time related to him and his  X girlfriend and conflict with another person who was giving them money later charged them of identity theft when he did not get the benefit out of from his girl friend. This is the version told by the patient.  Patient is currently working endorses less moodiness. No relapse back on drugs. Court case is going on well and is not disturbing him too much. He is also using a C-Pap machine.He has cut down his buspirone to  once a day. He has cut down neurontin  twice  Aday.  Increase seroquel to  last visit has helped for mood stabilization.  Sleep has improved. 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   Court case is going slow but his lawyer has mentioned that he may not get the jail times so he is  somewhat relieved He is going to the gym and try and keep herself physically active. Continues to take keppra for seizures. Says does not have therapy time as he works.  Depression severity. 7/10. 10 being no depression. Timing: more so all day Duration; recently worsen after jail Context: jail stay and drug use in past.  There is no associated symptoms of mania including racing thoughts excessive indulgence in activity or elevated mood.   Marland KitchenHe is attending NA classes and remains optimistic and regular in his followups.   Past Medical History  Diagnosis Date  . Low back pain   . Allergic rhinitis   . Seizure disorder    Family History  Problem Relation Age of Onset  . Depression Maternal Aunt   . Alcohol abuse Maternal Uncle     Outpatient Encounter Prescriptions as of 01/06/2015  Medication Sig  . busPIRone (BUSPAR) 15 MG tablet Take 1 tablet (15 mg total) by mouth daily.  . flunisolide (NASAREL) 29 MCG/ACT (0.025%) nasal spray Place 2 sprays into the nose.  . gabapentin (NEURONTIN) 600 MG tablet Take 1 tablet (600 mg total) by mouth 2 (two) times daily.  Marland Kitchen levETIRAcetam (KEPPRA) 500 MG tablet Take 500 mg by mouth.  . Loratadine 10 MG CAPS Take by mouth.  Marland Kitchen PARoxetine (PAXIL) 20 MG tablet Take one and one half tablet . Total of  now.  Marland Kitchen QUEtiapine (SEROQUEL) 100 MG tablet Take one and one half tablet. Total of  at night  . [DISCONTINUED] busPIRone (BUSPAR) 15 MG tablet Take 1 tablet (15 mg total) by mouth 3 (three) times daily.  . [DISCONTINUED] gabapentin (NEURONTIN) 600 MG tablet Take 1 tablet (600 mg total) by mouth 3 (three) times daily.  . [DISCONTINUED] PARoxetine (PAXIL) 20 MG tablet take 2 tablets by mouth once daily AT 8PM  . [DISCONTINUED] QUEtiapine (SEROQUEL) 100 MG tablet Take one and one half tablet. Total of  at night  No facility-administered encounter medications on file as of 01/06/2015.    No results found for this or any previous visit (from the past 2160 hour(s)).  BP 124/62 mmHg  Pulse 81  Ht 6\' 3"  (1.905 m)  Wt 270 lb (122.471 kg)  BMI 33.75 kg/m2  SpO2 96%   Review of Systems  Cardiovascular: Negative for chest pain.  Musculoskeletal: Positive for myalgias.  Skin: Negative for rash.  Neurological: Negative for tremors.  Psychiatric/Behavioral: Negative for suicidal ideas and substance abuse.    Mental Status Examination  Appearance: casual Alert: Yes Attention:  fair  Cooperative: Yes Eye Contact: Fair Speech: normal tone Psychomotor Activity: Decreased Memory/Concentration: adequate Oriented: place, time/date and situation Mood: more reactive mood. euthymic Affect: Congruent Thought Processes and Associations: Coherent Fund of Knowledge: Fair Thought Content: Suicidal ideation and Homicidal ideation were denied. No hallucinations Insight: Fair Judgement: Fair  Diagnosis: Polysubstance dependence opiate dependence. Unspecified episodic mood disorder. Major depressive disorder. Generalized anxiety disorder.   Treatment Plan:  Prescription given as follows gabapentin now 600 mg twice a day. Buspirone 15 mg once a day. Paxil 20 mg 1-1/2 tablet. Seroquel  150 mg at night. He has remained sober.   No Involuntary movements.  CPap helps at night.  He continues to take seroquel 150mg  .  Advised to follow up with primary care if he continues high dose of neurontin. For now he has meds. Reviewed sleep hygiene.   Continue therapy with Maralyn Sago that has helped Pertinent Labs and Relevant Prior Notes reviewed. Medication Side effects, benefits and risks reviewed/discussed with Patient. Time given for patient to respond and asks questions regarding the Diagnosis and Medications. Safety concerns and to report to ER if suicidal or call 911. Relevant Medications refilled or called in to pharmacy. Discussed weight maintenance. He continues to go Gym  Follow up with Primary care provider in regards to Medical conditions. Recommend compliance with medications and follow up office appointments. Discussed to avail opportunity to consider or/and continue Individual therapy with Counselor. Greater than 50% of time was spend in counseling and coordination of care with the patient.  Schedule for Follow up visit in  6  weeks or call in earlier as necessary.   Thresa Ross, MD 01/06/2015

## 2015-03-10 ENCOUNTER — Telehealth (HOSPITAL_COMMUNITY): Payer: Self-pay | Admitting: *Deleted

## 2015-03-10 DIAGNOSIS — F4310 Post-traumatic stress disorder, unspecified: Secondary | ICD-10-CM

## 2015-03-10 DIAGNOSIS — F0630 Mood disorder due to known physiological condition, unspecified: Secondary | ICD-10-CM

## 2015-03-10 DIAGNOSIS — F321 Major depressive disorder, single episode, moderate: Secondary | ICD-10-CM

## 2015-03-10 MED ORDER — BUSPIRONE HCL 15 MG PO TABS: 15.0000 mg | ORAL_TABLET | Freq: Every day | ORAL | Status: DC

## 2015-03-10 NOTE — Telephone Encounter (Signed)
PT called for a refill for Buspar . Per Dr. Gilmore Laroche, pt is authorized for a refill for Buspar , Qty 30. Pt has a f/u appt on 03/12/15. Pt verbalizes understanding.

## 2015-03-12 ENCOUNTER — Encounter (HOSPITAL_COMMUNITY): Payer: Self-pay | Admitting: Psychiatry

## 2015-03-12 ENCOUNTER — Ambulatory Visit (INDEPENDENT_AMBULATORY_CARE_PROVIDER_SITE_OTHER): Payer: BLUE CROSS/BLUE SHIELD | Admitting: Psychiatry

## 2015-03-12 VITALS — BP 126/70 | HR 81 | Ht 76.0 in | Wt 258.8 lb

## 2015-03-12 DIAGNOSIS — F1920 Other psychoactive substance dependence, uncomplicated: Secondary | ICD-10-CM

## 2015-03-12 DIAGNOSIS — F0630 Mood disorder due to known physiological condition, unspecified: Secondary | ICD-10-CM | POA: Diagnosis not present

## 2015-03-12 DIAGNOSIS — F321 Major depressive disorder, single episode, moderate: Secondary | ICD-10-CM | POA: Diagnosis not present

## 2015-03-12 DIAGNOSIS — F4310 Post-traumatic stress disorder, unspecified: Secondary | ICD-10-CM

## 2015-03-12 MED ORDER — BUSPIRONE HCL 15 MG PO TABS: 15.0000 mg | ORAL_TABLET | Freq: Two times a day (BID) | ORAL | Status: DC

## 2015-03-12 NOTE — Progress Notes (Addendum)
Patient ID: Ricky Foster, male   DOB: [birth year], 24 y.o.   MRN: [id number] [facility] Follow-up Outpatient Visit  Ricky Foster [id number] 23 y.o.  03/12/2015  Chief Complaint: Follow up for drug dependence and mood symptoms.      History of Present Illness:   Patient returns for Medication Follow up and is diagnosed with OPiate dependence and unspecified mood disorder. Major depressive disorder. Anxiety  He was seen initially after he has been in a rehabilitation center for drug dependence mostly for opiates, Crystal and marijuana. He was doing reasonable last visit we adjusted some of the medications. Apparently after that visit he had to serve some jail time related to him and his  X girlfriend and conflict with another person who was giving them money later charged them of identity theft when he did not get the benefit out of from his girl friend. This is the version told by the patient.  Patient is currently working and recently has noted increase agitation and at times anger feeling with no particular trigger.  No relapse back on drugs. Court case is going on well and is not disturbing him too much. He is also using a C-Pap machine.He has cut down his buspirone to  once a day. He is taking  neurontin  twice  Aday.  Other thing we have cut down has been buspirone to once a day. He feels some correlation of decreased dose of BuSpar but is not sure with anger.. Still feels animosity towards the ex-girlfriend but said he knows that he is not in harm anybody or himself. He understands the consequences He continues take Seroquel at night for mood stability and sleep Sleep has improved. 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   Continues to take keppra for seizures.   Depression severity. 6/10. 10 being no depression. Timing: more so all day Duration; recently worsen after jail Context: jail stay and drug use in past.  There is no  associated symptoms of mania including racing thoughts excessive indulgence in activity or elevated mood.  Marland KitchenHe is attending NA classes and remains optimistic and regular in his followups.   Past Medical History  Diagnosis Date  . Low back pain   . Allergic rhinitis   . Seizure disorder    Family History  Problem Relation Age of Onset  . Depression Maternal Aunt   . Alcohol abuse Maternal Uncle     Outpatient Encounter Prescriptions as of 03/12/2015  Medication Sig  . busPIRone (BUSPAR) 15 MG tablet Take 1 tablet (15 mg total) by mouth daily.  . flunisolide (NASAREL) 29 MCG/ACT (0.025%) nasal spray Place 2 sprays into the nose.  . gabapentin (NEURONTIN) 600 MG tablet Take 1 tablet (600 mg total) by mouth 2 (two) times daily.  Marland Kitchen levETIRAcetam (KEPPRA) 500 MG tablet Take 500 mg by mouth.  . Loratadine 10 MG CAPS Take by mouth.  Marland Kitchen PARoxetine (PAXIL) 20 MG tablet Take one and one half tablet . Total of  now.  Marland Kitchen QUEtiapine (SEROQUEL) 100 MG tablet Take one and one half tablet. Total of  at night   No facility-administered encounter medications on file as of 03/12/2015.    No results found for this or any previous visit (from the past 2160 hour(s)).  BP 126/70 mmHg  Pulse 81  Ht  (1.93 m)  Wt 258 lb 12.8 oz (117.391 kg)  BMI 31.52 kg/m2  SpO2 95%   Review of Systems  Constitutional:  Negative for fever.  Cardiovascular: Negative for chest pain.  Skin: Negative for rash.  Neurological: Negative for tremors.  Psychiatric/Behavioral: Negative for suicidal ideas and substance abuse. The patient is nervous/anxious.     Mental Status Examination  Appearance: casual Alert: Yes Attention: fair  Cooperative: Yes Eye Contact: Fair Speech: normal tone Psychomotor Activity: Decreased Memory/Concentration: adequate Oriented: place, time/date and situation Mood: more reactive mood. euthymic Affect: Congruent Thought Processes and Associations: Coherent Fund of  Knowledge: Fair Thought Content: Suicidal ideation and Homicidal ideation were denied. No hallucinations Insight: Fair Judgement: Fair  Diagnosis: Polysubstance dependence opiate dependence. Unspecified episodic mood disorder. Major depressive disorder. Generalized anxiety disorder.   Treatment Plan:   Increase Buspirone 15 mg back to bid for anxiety and mood. Prescription sent  Paxil 20 mg 1-1/2 tablet. Seroquel  150 mg at night.gabapentin 600mg  bid. He can call for these refills when due. He has remained sober.   No Involuntary movements.  CPap helps at night.  Labs at primary care.  Advised to follow up with primary care if he continues high dose of neurontin.  Reviewed sleep hygiene.   Restart  therapy with Maralyn Sago that has helped in past. Pertinent Labs and Relevant Prior Notes reviewed. Medication Side effects, benefits and risks reviewed/discussed with Patient. Time given for patient to respond and asks questions regarding the Diagnosis and Medications. Safety concerns and to report to ER if suicidal or call 911. Relevant Medications refilled or called in to pharmacy. Discussed weight maintenance. He continues to go Gym  Follow up with Primary care provider in regards to Medical conditions. Recommend compliance with medications and follow up office appointments. Discussed to avail opportunity to consider or/and continue Individual therapy with Counselor. Greater than 50% of time was spend in counseling and coordination of care with the patient.  Schedule for Follow up visit in  4  weeks or call in earlier as necessary.   Thresa Ross, MD 03/12/2015

## 2015-03-12 NOTE — Addendum Note (Signed)
Addended by: Thresa Ross on: 03/12/2015 09:57 AM   Modules accepted: Orders

## 2015-04-03 ENCOUNTER — Telehealth (HOSPITAL_COMMUNITY): Payer: Self-pay | Admitting: *Deleted

## 2015-04-03 DIAGNOSIS — F0630 Mood disorder due to known physiological condition, unspecified: Secondary | ICD-10-CM

## 2015-04-03 MED ORDER — QUETIAPINE FUMARATE 100 MG PO TABS: ORAL_TABLET | ORAL | Status: DC

## 2015-04-03 NOTE — Telephone Encounter (Signed)
Pt called for a refill for Seroquel . Per Dr. Gilmore Laroche, pt is authorized for a refill for Seroquel , #45. Prescription was sent to [facility]. Pt has a f/u appt on 9/20. Call and informed pt of prescription status. Pt verbalizes understanding.

## 2015-04-15 ENCOUNTER — Ambulatory Visit (HOSPITAL_COMMUNITY): Payer: Self-pay | Admitting: Psychiatry

## 2015-04-16 ENCOUNTER — Ambulatory Visit (HOSPITAL_COMMUNITY): Payer: Self-pay | Admitting: Licensed Clinical Social Worker

## 2015-04-23 ENCOUNTER — Telehealth (HOSPITAL_COMMUNITY): Payer: Self-pay

## 2015-04-23 DIAGNOSIS — F0630 Mood disorder due to known physiological condition, unspecified: Secondary | ICD-10-CM

## 2015-04-23 DIAGNOSIS — F4310 Post-traumatic stress disorder, unspecified: Secondary | ICD-10-CM

## 2015-04-24 MED ORDER — BUSPIRONE HCL 15 MG PO TABS: 15.0000 mg | ORAL_TABLET | Freq: Two times a day (BID) | ORAL | Status: DC

## 2015-04-24 NOTE — Telephone Encounter (Signed)
buspar refill sent as per patient request

## 2015-04-29 ENCOUNTER — Ambulatory Visit (INDEPENDENT_AMBULATORY_CARE_PROVIDER_SITE_OTHER): Payer: BLUE CROSS/BLUE SHIELD | Admitting: Psychiatry

## 2015-04-29 ENCOUNTER — Encounter (HOSPITAL_COMMUNITY): Payer: Self-pay | Admitting: Psychiatry

## 2015-04-29 DIAGNOSIS — F1920 Other psychoactive substance dependence, uncomplicated: Secondary | ICD-10-CM

## 2015-04-29 DIAGNOSIS — F4310 Post-traumatic stress disorder, unspecified: Secondary | ICD-10-CM

## 2015-04-29 DIAGNOSIS — F411 Generalized anxiety disorder: Secondary | ICD-10-CM | POA: Diagnosis not present

## 2015-04-29 DIAGNOSIS — F0630 Mood disorder due to known physiological condition, unspecified: Secondary | ICD-10-CM

## 2015-04-29 DIAGNOSIS — F321 Major depressive disorder, single episode, moderate: Secondary | ICD-10-CM

## 2015-04-29 DIAGNOSIS — F11288 Opioid dependence with other opioid-induced disorder: Secondary | ICD-10-CM

## 2015-04-29 MED ORDER — PAROXETINE HCL 20 MG PO TABS: ORAL_TABLET | ORAL | Status: DC

## 2015-04-29 MED ORDER — GABAPENTIN 600 MG PO TABS: 600.0000 mg | ORAL_TABLET | Freq: Two times a day (BID) | ORAL | Status: DC

## 2015-04-29 MED ORDER — QUETIAPINE FUMARATE 100 MG PO TABS: ORAL_TABLET | ORAL | Status: DC

## 2015-04-29 NOTE — Progress Notes (Signed)
Patient ID: Ricky Foster, male   DOB: 01/11/1991, 24 y.o.   MRN: [id number] [facility] Follow-up Outpatient Visit  Ricky Foster [id number] 24 y.o.  04/29/2015  Chief Complaint: Follow up for drug dependence and mood symptoms.      History of Present Illness:   Patient returns for Medication Follow up and is diagnosed with OPiate dependence and unspecified mood disorder. Major depressive disorder. Anxiety  He was seen initially after he has been in a rehabilitation center for drug dependence mostly for opiates, Crystal and marijuana. He was doing reasonable last visit we adjusted some of the medications. Apparently after that visit he had to serve some jail time related to him and his  X girlfriend and conflict with another person who was giving them money later charged them of identity theft when he did not get the benefit out of from his girl friend. This is the version told by the patient.  Patient is currently working and we adjusted his buspirone last visit, that has helped his anger and anxiety. . He has changed his job he remains sober he is feeling less agitated. Court case is going on well and is not disturbing him too much. He is also using a C-Pap machine.He has cut  Drug dependence: remains sober Still feels less animosity towards the ex-girlfriend but said he knows that he is not in harm anybody or himself. He understands the consequences He continues take Seroquel at night for mood stability and sleep Sleep has improved. 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   Continues to take keppra for seizures.   Depression severity. 6/10. 10 being no depression. Timing: more so all day Duration; recently worsen after jail Context: jail stay and drug use in past.  There is no associated symptoms of mania including racing thoughts excessive indulgence in activity or elevated mood.  Marland KitchenHe is attending NA classes and remains optimistic and regular  in his followups.   Past Medical History  Diagnosis Date  . Low back pain   . Allergic rhinitis   . Seizure disorder (HCC)    Family History  Problem Relation Age of Onset  . Depression Maternal Aunt   . Alcohol abuse Maternal Uncle     Outpatient Encounter Prescriptions as of 04/29/2015  Medication Sig  . busPIRone (BUSPAR) 15 MG tablet Take 1 tablet (15 mg total) by mouth 2 (two) times daily.  . flunisolide (NASAREL) 29 MCG/ACT (0.025%) nasal spray Place 2 sprays into the nose.  . gabapentin (NEURONTIN) 600 MG tablet Take 1 tablet (600 mg total) by mouth 2 (two) times daily.  Marland Kitchen levETIRAcetam (KEPPRA) 500 MG tablet Take 500 mg by mouth.  . Loratadine 10 MG CAPS Take by mouth.  Marland Kitchen PARoxetine (PAXIL) 20 MG tablet Take one and one half tablet . Total of  now.  Marland Kitchen QUEtiapine (SEROQUEL) 100 MG tablet Take one and one half tablet. Total of  at night  . [DISCONTINUED] gabapentin (NEURONTIN) 600 MG tablet Take 1 tablet (600 mg total) by mouth 2 (two) times daily.  . [DISCONTINUED] PARoxetine (PAXIL) 20 MG tablet Take one and one half tablet . Total of  now.  . [DISCONTINUED] QUEtiapine (SEROQUEL) 100 MG tablet Take one and one half tablet. Total of  at night   No facility-administered encounter medications on file as of 04/29/2015.    No results found for this or any previous visit (from the past 2160 hour(s)).  There were no vitals taken  for this visit.   Review of Systems  Constitutional: Negative for fever.  Cardiovascular: Negative for chest pain.  Skin: Negative for rash.  Neurological: Negative for tingling and tremors.  Psychiatric/Behavioral: Negative for suicidal ideas and substance abuse.    Mental Status Examination  Appearance: casual Alert: Yes Attention: fair  Cooperative: Yes Eye Contact: Fair Speech: normal tone Psychomotor Activity: Decreased Memory/Concentration: adequate Oriented: place, time/date and situation Mood: more reactive mood.  euthymic Affect: Congruent Thought Processes and Associations: Coherent Fund of Knowledge: Fair Thought Content: Suicidal ideation and Homicidal ideation were denied. No hallucinations Insight: Fair Judgement: Fair  Diagnosis: Polysubstance dependence opiate dependence. Unspecified episodic mood disorder. Major depressive disorder. Generalized anxiety disorder.   Treatment Plan:   Mood more balanced. Will renew   Paxil 20 mg 1-1/2 tablet. Seroquel  150 mg at night.gabapentin  bid. He has remained sober.  Continue buspirone for augmentation for mood symptoms and agitation.  No Involuntary movements.  Drug dependence: remains sober  CPap helps at night.  Labs at primary care. Advised to get prolactin level, Hemoglobin A1c and lipid profile. Advised to follow up with primary care if he continues high dose of neurontin.  Reviewed sleep hygiene.   Restart  therapy with Maralyn Sago that has helped in past. Pertinent Labs and Relevant Prior Notes reviewed. Medication Side effects, benefits and risks reviewed/discussed with Patient. Time given for patient to respond and asks questions regarding the Diagnosis and Medications. Safety concerns and to report to ER if suicidal or call 911. Relevant Medications refilled or called in to pharmacy. Discussed weight maintenance. He continues to go Gym  Follow up with Primary care provider in regards to Medical conditions. Recommend compliance with medications and follow up office appointments. Discussed to avail opportunity to consider or/and continue Individual therapy with Counselor. Greater than 50% of time was spend in counseling and coordination of care with the patient.  Schedule for Follow up visit in  8  weeks or call in earlier as necessary.   Thresa Ross, MD 04/29/2015

## 2015-05-22 ENCOUNTER — Telehealth (HOSPITAL_COMMUNITY): Payer: Self-pay

## 2015-05-22 DIAGNOSIS — F0630 Mood disorder due to known physiological condition, unspecified: Secondary | ICD-10-CM

## 2015-05-22 DIAGNOSIS — F4310 Post-traumatic stress disorder, unspecified: Secondary | ICD-10-CM

## 2015-05-22 MED ORDER — BUSPIRONE HCL 15 MG PO TABS: 15.0000 mg | ORAL_TABLET | Freq: Two times a day (BID) | ORAL | Status: DC

## 2015-05-22 NOTE — Telephone Encounter (Signed)
Buspirone sent as refill request

## 2015-05-30 DIAGNOSIS — G4737 Central sleep apnea in conditions classified elsewhere: Secondary | ICD-10-CM | POA: Insufficient documentation

## 2015-05-30 DIAGNOSIS — Z87898 Personal history of other specified conditions: Secondary | ICD-10-CM | POA: Insufficient documentation

## 2015-06-02 ENCOUNTER — Telehealth (HOSPITAL_COMMUNITY): Payer: Self-pay

## 2015-06-02 DIAGNOSIS — F0630 Mood disorder due to known physiological condition, unspecified: Secondary | ICD-10-CM

## 2015-06-05 MED ORDER — QUETIAPINE FUMARATE 100 MG PO TABS: ORAL_TABLET | ORAL | Status: DC

## 2015-06-05 NOTE — Telephone Encounter (Signed)
PT called for a refill for Seroquel. Per Dr. Gilmore LarocheAkhtar, pt is authorized for a refill for Seroquel 100mg , #45. Prescription was sent to pharmacy. Pt has a f/u appt on 06/27/15. Called and informed pt of prescription status. Pt verbalizes understanding.

## 2015-06-23 ENCOUNTER — Telehealth (HOSPITAL_COMMUNITY): Payer: Self-pay | Admitting: *Deleted

## 2015-06-23 DIAGNOSIS — F4310 Post-traumatic stress disorder, unspecified: Secondary | ICD-10-CM

## 2015-06-23 DIAGNOSIS — F0630 Mood disorder due to known physiological condition, unspecified: Secondary | ICD-10-CM

## 2015-06-23 MED ORDER — BUSPIRONE HCL 15 MG PO TABS: 15.0000 mg | ORAL_TABLET | Freq: Two times a day (BID) | ORAL | Status: DC

## 2015-06-23 NOTE — Telephone Encounter (Signed)
Pt called for a refill for Buspar 15mg . Per Dr. Gilmore LarocheAkhtar, pt is authorized for a refill for Buspar 15mg , #60. Prescription was sent to pharmacy. Pt has a f/u appt on 06/27/15. Called and informed pt of prescription status. Pt verbalizes understanding.

## 2015-06-27 ENCOUNTER — Ambulatory Visit (INDEPENDENT_AMBULATORY_CARE_PROVIDER_SITE_OTHER): Payer: BLUE CROSS/BLUE SHIELD | Admitting: Psychiatry

## 2015-06-27 ENCOUNTER — Encounter (HOSPITAL_COMMUNITY): Payer: Self-pay | Admitting: Psychiatry

## 2015-06-27 ENCOUNTER — Telehealth (HOSPITAL_COMMUNITY): Payer: Self-pay | Admitting: *Deleted

## 2015-06-27 VITALS — BP 124/68 | HR 92 | Ht 76.0 in | Wt 267.0 lb

## 2015-06-27 DIAGNOSIS — F4310 Post-traumatic stress disorder, unspecified: Secondary | ICD-10-CM

## 2015-06-27 DIAGNOSIS — F321 Major depressive disorder, single episode, moderate: Secondary | ICD-10-CM | POA: Diagnosis not present

## 2015-06-27 DIAGNOSIS — F411 Generalized anxiety disorder: Secondary | ICD-10-CM | POA: Diagnosis not present

## 2015-06-27 DIAGNOSIS — F0630 Mood disorder due to known physiological condition, unspecified: Secondary | ICD-10-CM | POA: Diagnosis not present

## 2015-06-27 DIAGNOSIS — F1920 Other psychoactive substance dependence, uncomplicated: Secondary | ICD-10-CM | POA: Diagnosis not present

## 2015-06-27 MED ORDER — GABAPENTIN 600 MG PO TABS: 600.0000 mg | ORAL_TABLET | Freq: Two times a day (BID) | ORAL | Status: DC

## 2015-06-27 MED ORDER — PAROXETINE HCL 40 MG PO TABS: ORAL_TABLET | ORAL | Status: DC

## 2015-06-27 MED ORDER — QUETIAPINE FUMARATE 100 MG PO TABS: ORAL_TABLET | ORAL | Status: DC

## 2015-06-27 MED ORDER — BUSPIRONE HCL 15 MG PO TABS: 15.0000 mg | ORAL_TABLET | Freq: Two times a day (BID) | ORAL | Status: DC

## 2015-06-27 NOTE — Telephone Encounter (Signed)
[facility] for Paxil 40mg . Per Dr. Gilmore LarocheAkhtar, pt will need to take 1 tablet (40mg ) daily. [facility]

## 2015-06-27 NOTE — Progress Notes (Signed)
Patient ID: Ricky Foster DoctorBrandon Deshpande, male   DOB: Jan 09, 1991, 24 y.o.   MRN: [id number] [facility]  Ricky Foster DoctorBrandon Conley [id number] 24 y.o.  06/27/2015  Chief Complaint: Follow up for drug dependence and mood symptoms.      History of Present Illness:   Patient returns for Medication Follow up and is diagnosed with OPiate dependence and unspecified mood disorder. Major depressive disorder. Anxiety  He was seen initially after he has been in a rehabilitation center for drug dependence mostly for opiates, Crystal and marijuana. He was doing reasonable last visit we adjusted some of the medications. Apparently after that visit he had to serve some jail time related to him and his  X girlfriend and conflict with another person who was giving them money later charged them of identity theft when he did not get the benefit out of from his girl friend. This is the version told by the patient.  Mood disorder: balanced but more down feeling these days in winter. He is working that helps some,. No specific triggers but short and cold days adds to depression. Drug dependence: remains sober Still feels less animosity towards the ex-girlfriend but said he knows that he is not in harm anybody or himself. He understands the consequences He continues take Seroquel at night for mood stability and sleep Sleep has improved. He continues to work and is now working at [employer]  Continues to take [employer] Energy[employer]ppra for seizures.   Depression severity. 6/10. 10 being no depression. Timing: more so all day Duration; 5 years Context: jail stay and drug use in past.  There is no associated symptoms of mania including racing thoughts excessive indulgence in activity or elevated mood.  Marland Kitchen.He is attending NA classes and remains optimistic and regular in his followups.   Past Medical History  Diagnosis Date  . Low back pain   . Allergic rhinitis   . Seizure disorder (HCC)    Family  History  Problem Relation Age of Onset  . Depression Maternal Aunt   . Alcohol abuse Maternal Uncle     Outpatient Encounter Prescriptions as of 06/27/2015  Medication Sig  . busPIRone (BUSPAR) 15 MG tablet Take 1 tablet (15 mg total) by mouth 2 (two) times daily.  . flunisolide (NASAREL) 29 MCG/ACT (0.025%) nasal spray Place 2 sprays into the nose.  . gabapentin (NEURONTIN) 600 MG tablet Take 1 tablet (600 mg total) by mouth 2 (two) times daily.  Marland Kitchen. levETIRAcetam (KEPPRA) 500 MG tablet Take 500 mg by mouth.  . Loratadine 10 MG CAPS Take by mouth.  Marland Kitchen. PARoxetine (PAXIL) 40 MG tablet Take one and one half tablet . Total of 30mg  now.  Marland Kitchen. QUEtiapine (SEROQUEL) 100 MG tablet Take one and one half tablet. Total of 150mg  at night  . [DISCONTINUED] busPIRone (BUSPAR) 15 MG tablet Take 1 tablet (15 mg total) by mouth 2 (two) times daily.  . [DISCONTINUED] gabapentin (NEURONTIN) 600 MG tablet Take 1 tablet (600 mg total) by mouth 2 (two) times daily.  . [DISCONTINUED] PARoxetine (PAXIL) 20 MG tablet Take one and one half tablet . Total of 30mg  now.  . [DISCONTINUED] QUEtiapine (SEROQUEL) 100 MG tablet Take one and one half tablet. Total of 150mg  at night   No facility-administered encounter medications on file as of 06/27/2015.    No results found for this or any previous visit (from the past 2160 hour(s)).  BP 124/68 mmHg  Pulse 92  Ht 6\' 4"  (1.93 m)  Wt 267 lb (  121.11 kg)  BMI 32.51 kg/m2  SpO2 94%   Review of Systems  Constitutional: Negative for fever.  Cardiovascular: Negative for chest pain.  Skin: Negative for rash.  Neurological: Negative for tingling and tremors.  Psychiatric/Behavioral: Positive for depression. Negative for suicidal ideas and substance abuse.    Mental Status Examination  Appearance: casual Alert: Yes Attention: fair  Cooperative: Yes Eye Contact: Fair Speech: normal tone Psychomotor Activity: Decreased Memory/Concentration: adequate Oriented: place,  time/date and situation Mood: somewhat down.  Affect: Congruent Thought Processes and Associations: Coherent Fund of Knowledge: Fair Thought Content: Suicidal ideation and Homicidal ideation were denied. No hallucinations Insight: Fair Judgement: Fair  Diagnosis: Polysubstance dependence opiate dependence. Unspecified episodic mood disorder. Major depressive disorder. Generalized anxiety disorder.   Treatment Plan:    Depression : will increase paxil to  for seasonal depression. If needed will add wellbutrin small dose next visit.  Schedule for therapy for depression.  continue Seroquel  150 mg at night.gabapentin  bid. He has remained sober.  Continue buspirone for augmentation for mood symptoms and agitation.  No Involuntary movements.  Drug dependence: remains sober  CPap helps at night.  Labs at primary care. Advised to get prolactin level, Hemoglobin A1c and lipid profile. Advised to follow up with primary care if he continues high dose of neurontin.  Reviewed sleep hygiene.   Restart  therapy with Maralyn Sago that has helped in past. Pertinent Labs and Relevant Prior Notes reviewed. Medication Side effects, benefits and risks reviewed/discussed with Patient. Time given for patient to respond and asks questions regarding the Diagnosis and Medications. Safety concerns and to report to ER if suicidal or call 911. Relevant Medications refilled or called in to pharmacy. Discussed weight maintenance. He continues to go Gym  Follow up with Primary care provider in regards to Medical conditions. Recommend compliance with medications and follow up office appointments. Discussed to avail opportunity to consider or/and continue Individual therapy with Counselor. Greater than 50% of time was spend in counseling and coordination of care with the patient.  Schedule for Follow up visit in  4  weeks or call in earlier as necessary.  Time spent: 25 minutes Thresa Ross,  MD 06/27/2015

## 2015-07-07 ENCOUNTER — Ambulatory Visit (INDEPENDENT_AMBULATORY_CARE_PROVIDER_SITE_OTHER): Payer: BLUE CROSS/BLUE SHIELD | Admitting: Licensed Clinical Social Worker

## 2015-07-07 DIAGNOSIS — F0630 Mood disorder due to known physiological condition, unspecified: Secondary | ICD-10-CM

## 2015-07-07 DIAGNOSIS — F4310 Post-traumatic stress disorder, unspecified: Secondary | ICD-10-CM

## 2015-07-07 NOTE — Progress Notes (Signed)
   THERAPIST PROGRESS NOTE  Session Time: 3:15pm-4:05pm  Participation Level: Active  Behavioral Response: CasualAlertAnxious  Type of Therapy: Individual Therapy  Treatment Goals addressed: Developed treatment goals today  Interventions: Assessment and treatment planning  Suicidal/Homicidal: Admitted to some homicidal thoughts about the man who pressed charges against him, denies plan or intent Denies SI  Therapist Interventions: Gathered information about patient's decision to return for therapy at this time.  Assessed for severity of symptoms of depression and anxiety.   Collaborated with patient to develop a new treatment plan.  Decided on a long term goal and some short term goals.   Addressed concerns about having obsessive thoughts about wanting to harm others.  Explained to patient that he does not have to place any meaning on these thoughts when they enter his consciousness.  He can choose to focus on something else.      Summary: Explained that he did not continue therapy in the spring because he got a job which did not allow for flexibility in scheduling appointments.  Now has a new job.  Reports his boss is supportive of him attending appointments.  Reported he decided to return because he "hit this huge depression slump in the past month."  Finds himself isolating a lot.  Not sleeping well.  Having nightmares.  Lack of energy.  Also expressed concern about excessive anger.  He said, "I can barely go into [employer] without snapping someone's neck."  Admitted to having dreams about killing the man who pressed charges against him last year.  Also admitted to frequent obsessive thoughts of doing harm to others.  This concerned him so much he stopped playing shooter video games.  Reports being deterred from acting on his thoughts because he doesn't want to go to jail and he doesn't want his family to suffer on his account.  Denies doing any actual harm to others.  Indicated he would  like to primarily focus on reducing his depressive symptoms at this time.     Plan: Return again in one to two weeks.  Indicated he would like to attend therapy appointments on a weekly basis.  Will introduce mindfulness as a way to cope with unhelpful thinking.    Diagnosis: PTSD                         Mood Disoder    Darrin LuisSolomon, Sarah A, LCSW 07/07/2015

## 2015-07-22 ENCOUNTER — Other Ambulatory Visit (HOSPITAL_COMMUNITY): Payer: Self-pay | Admitting: Psychiatry

## 2015-07-24 ENCOUNTER — Ambulatory Visit (INDEPENDENT_AMBULATORY_CARE_PROVIDER_SITE_OTHER): Payer: BLUE CROSS/BLUE SHIELD | Admitting: Licensed Clinical Social Worker

## 2015-07-24 DIAGNOSIS — F4310 Post-traumatic stress disorder, unspecified: Secondary | ICD-10-CM

## 2015-07-24 DIAGNOSIS — F0630 Mood disorder due to known physiological condition, unspecified: Secondary | ICD-10-CM

## 2015-07-24 NOTE — Progress Notes (Signed)
   THERAPIST PROGRESS NOTE  Session Time: 3:15pm-4:10pm  Participation Level: Active  Behavioral Response: CasualAlertAnxious  Type of Therapy: Individual Therapy  Treatment Goals addressed: Elevate mood and how evidence of usual energy, activities, and socialization  Interventions: Mindfulness  Suicidal/Homicidal: Has continued to have some homicidal thoughts about the man who pressed charges against him, denies plan or intent, reports having some success shifting his focus to other things Denies SI  Therapist Interventions:  Introduced the concept of mindfulness.  Emphasized how learning to focus on the present can help you to feel more in control of your emotions.  Explained how it can be useful to practice at times when you catch yourself having unhelpful thoughts.  Described how you can choose to do tasks in Foster mindful way.  Guided patient through practicing mindfulness in different ways including focusing on an object and mindful breathing.  Encouraged patient to practice the skills regularly in addition to times of distress.    Summary:      Indicated he is excited to start using the mindfulness skills.  Believes that it will help him to stop obsessing over past and future situations.  Agreed to practice the skills. Reported no significant changes to his mood.  Noted that he was not into celebrating Christmas like usual.  Admitted to wanting to sleep all the time and wanting to avoid feeling any feelings.       Plan: Scheduled to return in 1-2 weeks.  Will check on implementation of mindfulness skills and expand on the concept explaining the differences between doing mode and being mode.  Diagnosis: PTSD                         Mood Disoder    Ricky LuisSolomon, Ricky Lastinger A, LCSW 07/24/2015

## 2015-07-28 ENCOUNTER — Other Ambulatory Visit (HOSPITAL_COMMUNITY): Payer: Self-pay | Admitting: Psychiatry

## 2015-07-31 NOTE — Telephone Encounter (Signed)
Received medication refill for Buspar 15mg . Per Dr. Gilmore LarocheAkhtar, pt is authorized for a refill for Buspar 15mg , #60. Rx was sent to pharmacy. Pt has a f/u appt on 08/01/15. Called and informed pt of Rx status. Pt verbalizes understanding.

## 2015-08-01 ENCOUNTER — Ambulatory Visit (HOSPITAL_COMMUNITY): Payer: BLUE CROSS/BLUE SHIELD | Admitting: Psychiatry

## 2015-08-05 NOTE — Telephone Encounter (Signed)
[facility] . Per Dr. Gilmore LarocheAkhtar, medication is denied. Pt will need to schedule appt before refill could be issued. Pt no showed appt on 08/01/15, and it schedule for a f/u appt on 08/07/15. Called and informed pt of rx status. Pt verbalizes understanding.

## 2015-08-07 ENCOUNTER — Ambulatory Visit (INDEPENDENT_AMBULATORY_CARE_PROVIDER_SITE_OTHER): Payer: BLUE CROSS/BLUE SHIELD | Admitting: Psychiatry

## 2015-08-07 ENCOUNTER — Encounter (HOSPITAL_COMMUNITY): Payer: Self-pay | Admitting: Psychiatry

## 2015-08-07 VITALS — BP 110/64 | HR 83 | Ht 76.0 in | Wt 269.0 lb

## 2015-08-07 DIAGNOSIS — F4310 Post-traumatic stress disorder, unspecified: Secondary | ICD-10-CM

## 2015-08-07 DIAGNOSIS — F1920 Other psychoactive substance dependence, uncomplicated: Secondary | ICD-10-CM | POA: Diagnosis not present

## 2015-08-07 DIAGNOSIS — F3130 Bipolar disorder, current episode depressed, mild or moderate severity, unspecified: Secondary | ICD-10-CM | POA: Diagnosis not present

## 2015-08-07 DIAGNOSIS — F0630 Mood disorder due to known physiological condition, unspecified: Secondary | ICD-10-CM

## 2015-08-07 DIAGNOSIS — F321 Major depressive disorder, single episode, moderate: Secondary | ICD-10-CM

## 2015-08-07 MED ORDER — QUETIAPINE FUMARATE 100 MG PO TABS: ORAL_TABLET | ORAL | Status: DC

## 2015-08-07 MED ORDER — BUPROPION HCL 100 MG PO TABS: 100.0000 mg | ORAL_TABLET | Freq: Every day | ORAL | Status: DC

## 2015-08-07 MED ORDER — PAROXETINE HCL 40 MG PO TABS: ORAL_TABLET | ORAL | Status: DC

## 2015-08-07 MED ORDER — BUSPIRONE HCL 15 MG PO TABS: 15.0000 mg | ORAL_TABLET | Freq: Two times a day (BID) | ORAL | Status: DC

## 2015-08-07 MED ORDER — GABAPENTIN 600 MG PO TABS: 600.0000 mg | ORAL_TABLET | Freq: Two times a day (BID) | ORAL | Status: DC

## 2015-08-07 NOTE — Progress Notes (Signed)
Patient ID: Ricky Foster DoctorBrandon Bejar, male   DOB: 09-12-1990, 25 y.o.   MRN: [id number] [facility]  Ricky Foster DoctorBrandon Buttery [id number] 24 y.o.  08/07/2015  Chief Complaint: Follow up for drug dependence and mood symptoms.      History of Present Illness:   Patient returns for Medication Follow up and is diagnosed with OPiate dependence and unspecified mood disorder. Major depressive disorder. Anxiety  He was seen initially after he has been in a rehabilitation center for drug dependence mostly for opiates, Crystal and marijuana. He was doing reasonable last visit we adjusted some of the medications. Apparently after that visit he had to serve some jail time related to him and his  X girlfriend and conflict with another person who was giving them money later charged them of identity theft when he did not get the benefit out of from his girl friend. This is the version told by the patient.  Mood disorder: Despite all his medications including Seroquel Paxil. He still feels down since winter was top he mentioned about his brother who is been asking money from family members. Also financially he is in depth. Drug dependence: remains sober 18 months. Some craving since he is feeling more down during the wintertime Still feels less animosity towards the ex-girlfriend but said he knows that he is not in harm anybody or himself. He understands the consequences He continues take Seroquel at night for mood stability and sleep Sleep has improved. 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   Depression severity. 5/10. 10 being no depression. worsened Timing: more so all day Duration; 5 years Context: jail stay and drug use in past.  There is no associated symptoms of mania including racing thoughts excessive indulgence in activity or elevated mood.  Marland Kitchen.He is attending NA classes and remains optimistic and regular in his followups.   Past Medical  History  Diagnosis Date  . Low back pain   . Allergic rhinitis   . Seizure disorder (HCC)    Family History  Problem Relation Age of Onset  . Depression Maternal Aunt   . Alcohol abuse Maternal Uncle     Outpatient Encounter Prescriptions as of 08/07/2015  Medication Sig  . busPIRone (BUSPAR) 15 MG tablet Take 1 tablet (15 mg total) by mouth 2 (two) times daily.  . flunisolide (NASAREL) 29 MCG/ACT (0.025%) nasal spray Place 2 sprays into the nose.  . gabapentin (NEURONTIN) 600 MG tablet Take 1 tablet (600 mg total) by mouth 2 (two) times daily.  Marland Kitchen. levETIRAcetam (KEPPRA) 500 MG tablet Take 500 mg by mouth.  Marland Kitchen. PARoxetine (PAXIL) 40 MG tablet Take one tablet daily (40mg  total).  . QUEtiapine (SEROQUEL) 100 MG tablet Take one and one half tablet. Total of 150mg  at night  . [DISCONTINUED] busPIRone (BUSPAR) 15 MG tablet Take 1 tablet (15 mg total) by mouth 2 (two) times daily.  . [DISCONTINUED] gabapentin (NEURONTIN) 600 MG tablet Take 1 tablet (600 mg total) by mouth 2 (two) times daily.  . [DISCONTINUED] PARoxetine (PAXIL) 40 MG tablet Take one tablet daily (40mg  total).  . [DISCONTINUED] QUEtiapine (SEROQUEL) 100 MG tablet Take one and one half tablet. Total of 150mg  at night  . buPROPion (WELLBUTRIN) 100 MG tablet Take 1 tablet (100 mg total) by mouth daily.  . Loratadine 10 MG CAPS Take by mouth. Reported on 08/07/2015  . [DISCONTINUED] busPIRone (BUSPAR) 15 MG tablet take 1 tablet by mouth twice a day (Patient not taking:  Reported on 08/07/2015)   No facility-administered encounter medications on file as of 08/07/2015.    No results found for this or any previous visit (from the past 2160 hour(s)).  BP 110/64 mmHg  Pulse 83  Ht 6\' 4"  (1.93 m)  Wt 269 lb (122.018 kg)  BMI 32.76 kg/m2  SpO2 95%   Review of Systems  Constitutional: Negative for fever.  Cardiovascular: Negative for chest pain and palpitations.  Skin: Negative for rash.  Neurological: Negative for tingling and  tremors.  Psychiatric/Behavioral: Positive for depression. Negative for suicidal ideas and substance abuse.    Mental Status Examination  Appearance: casual Alert: Yes Attention: fair  Cooperative: Yes Eye Contact: Fair Speech: normal tone Psychomotor Activity: Decreased Memory/Concentration: adequate Oriented: place, time/date and situation Mood: somewhat down.  Affect: Congruent Thought Processes and Associations: Coherent Fund of Knowledge: Fair Thought Content: Suicidal ideation and Homicidal ideation were denied. No hallucinations Insight: Fair Judgement: Fair  Diagnosis: Polysubstance dependence opiate dependence. Unspecified episodic mood disorder. Major depressive disorder. Generalized anxiety disorder.   Treatment Plan:    Depression :continue paxil. will add wellbutrin 100mg  qd for augmentation. Continue  therapy for depression.  continue Seroquel  150 mg at night.gabapentin 600mg  bid. He has remained sober. Advised to call sponsor daily and attend NA Continue buspirone for augmentation for mood symptoms and agitation.  No Involuntary movements.  Drug dependence: remains sober  CPap helps at night.  Labs at primary care. Advised to get prolactin level, Hemoglobin A1c and lipid profile. Advised to follow up with primary care if he continues high dose of neurontin.  Reviewed sleep hygiene.   Restart  therapy with Maralyn Sago that has helped in past. Pertinent Labs and Relevant Prior Notes reviewed. Medication Side effects, benefits and risks reviewed/discussed with Patient. Time given for patient to respond and asks questions regarding the Diagnosis and Medications. Safety concerns and to report to ER if suicidal or call 911. Relevant Medications refilled or called in to pharmacy. Discussed weight maintenance. He continues to go Gym  Follow up with Primary care provider in regards to Medical conditions. Recommend compliance with medications and follow up office  appointments. Discussed to avail opportunity to consider or/and continue Individual therapy with Counselor. Greater than 50% of time was spend in counseling and coordination of care with the patient.  Schedule for Follow up visit in  4  weeks or call in earlier as necessary.  Time spent: 25 minutes Thresa Ross, MD 08/07/2015

## 2015-08-11 ENCOUNTER — Ambulatory Visit (HOSPITAL_COMMUNITY): Payer: BLUE CROSS/BLUE SHIELD | Admitting: Licensed Clinical Social Worker

## 2015-08-22 ENCOUNTER — Ambulatory Visit (INDEPENDENT_AMBULATORY_CARE_PROVIDER_SITE_OTHER): Payer: BLUE CROSS/BLUE SHIELD | Admitting: Licensed Clinical Social Worker

## 2015-08-22 DIAGNOSIS — F0630 Mood disorder due to known physiological condition, unspecified: Secondary | ICD-10-CM | POA: Diagnosis not present

## 2015-08-22 DIAGNOSIS — F4310 Post-traumatic stress disorder, unspecified: Secondary | ICD-10-CM | POA: Diagnosis not present

## 2015-08-22 DIAGNOSIS — F1921 Other psychoactive substance dependence, in remission: Secondary | ICD-10-CM

## 2015-08-22 NOTE — Progress Notes (Signed)
   THERAPIST PROGRESS NOTE  Session Time: 11:15am-12:15pm  Participation Level: Active  Behavioral Response: Casual Alert Euthymic  Type of Therapy: Family Therapy- Mom, Mary   Treatment Goals addressed: Elevate mood and show evidence of usual energy, activities, and socialization  Interventions: Interpersonal effectiveness  Suicidal/Homicidal: Admitted to HI about the man who pressed charges against him and his ex girlfriend,  denies plan or intent Denies SI  Therapist Interventions:    Patient asked to include his mom in the session today.  Said he wanted her to know about the types of things he is learning about in therapy. Started developing a recovery plan.  Specifically addressed his tendencies to isolate.  Prompted him to identify supports he can turn to when he is in distress.   Offered suggestions for how mom can approach patient when she observes that he is isolating or seems to be upset.     Summary:      Identified 3 people outside of his immediate family he can turn to when feeling distressed.  Acknowledged that these are people he has learned he can trust and they have assured him that talking to him is not a burden. Indicated he would be open to his mom approaching him when she observes concerning behavior.  Was able to describe the type of wording he would prefer she use when she does approach him.     Reported in some ways he feels things are improving: has continued to stay sober (19 months), consistently attending NA meetings, has been using mindfulness and found it to be helpful in the moment.  Noted continuing to feel "lost and confused."  Angry about results from ex's trial saying "She basically got a slap on the wrist."  He has a court date scheduled for mid-February.  [employer] is pursuing a plea deal.            Plan: Scheduled to return next week.  Patient says he is going to invite both his parents to attend the appointment.  Will work on refining a  Relapse Plan.      Diagnosis: PTSD                         Mood Disoder    Ricky Foster 08/22/2015

## 2015-08-25 ENCOUNTER — Ambulatory Visit (INDEPENDENT_AMBULATORY_CARE_PROVIDER_SITE_OTHER): Payer: BLUE CROSS/BLUE SHIELD | Admitting: Psychiatry

## 2015-08-25 ENCOUNTER — Encounter (HOSPITAL_COMMUNITY): Payer: Self-pay | Admitting: Psychiatry

## 2015-08-25 VITALS — BP 120/70 | HR 91 | Ht 76.0 in | Wt 275.0 lb

## 2015-08-25 DIAGNOSIS — F1920 Other psychoactive substance dependence, uncomplicated: Secondary | ICD-10-CM | POA: Diagnosis not present

## 2015-08-25 DIAGNOSIS — F0630 Mood disorder due to known physiological condition, unspecified: Secondary | ICD-10-CM | POA: Diagnosis not present

## 2015-08-25 DIAGNOSIS — F4310 Post-traumatic stress disorder, unspecified: Secondary | ICD-10-CM | POA: Diagnosis not present

## 2015-08-25 DIAGNOSIS — F1921 Other psychoactive substance dependence, in remission: Secondary | ICD-10-CM

## 2015-08-25 DIAGNOSIS — F321 Major depressive disorder, single episode, moderate: Secondary | ICD-10-CM

## 2015-08-25 MED ORDER — BUSPIRONE HCL 15 MG PO TABS: 15.0000 mg | ORAL_TABLET | Freq: Three times a day (TID) | ORAL | Status: DC

## 2015-08-25 MED ORDER — BUPROPION HCL 100 MG PO TABS: 100.0000 mg | ORAL_TABLET | Freq: Every day | ORAL | Status: DC

## 2015-08-25 MED ORDER — PAROXETINE HCL 40 MG PO TABS: ORAL_TABLET | ORAL | Status: DC

## 2015-08-25 NOTE — Progress Notes (Signed)
Patient ID: Ricky Foster, male   DOB: 1990/08/25, 26 y.o.   MRN: [id number] [facility] Follow-up Outpatient Visit  Ricky Foster [id number] 24 y.o.  08/25/2015  Chief Complaint: Follow up for drug dependence and mood symptoms.      History of Present Illness:   Patient returns for Medication Follow up and is diagnosed with OPiate dependence and unspecified mood disorder. Major depressive disorder. Anxiety  He was seen initially after he has been in a rehabilitation center for drug dependence mostly for opiates, Crystal and marijuana. He was doing reasonable last visit we adjusted some of the medications. Apparently after that visit he had to serve some jail time related to him and his  X girlfriend and conflict with another person who was giving them money later charged them of identity theft when he did not get the benefit out of from his girl friend. This is the version told by the patient.  Mood disorder: He was feeling down we added wellbutrin last visit. It helped him he felt more balanced but then the court came came back and he is worried about the elderly man who is trying to give him a hard time in Court. He is getting upset about court and this brought him back to stress levels he is having some feelings of craving  Drug dependence: remains sober 18 months. Some craving since he is feeling more down during the wintertime Still feels less animosity towards the ex-girlfriend but said he knows that he is not in harm anybody or himself. He understands the consequences He continues take Seroquel at night for mood stability and sleep Sleep fluctuating depending upon stress. 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  Depression severity. 5/10. 10 being no depression. worsened Timing: more so all day Duration; 5 years Context: jail stay and drug use in past.  There is no associated symptoms of mania including racing thoughts excessive  indulgence in activity or elevated mood.  Marland KitchenHe is attending NA classes and remains optimistic and regular in his followups.   Past Medical History  Diagnosis Date  . Low back pain   . Allergic rhinitis   . Seizure disorder (HCC)    Family History  Problem Relation Age of Onset  . Depression Maternal Aunt   . Alcohol abuse Maternal Uncle     Outpatient Encounter Prescriptions as of 08/25/2015  Medication Sig  . buPROPion (WELLBUTRIN) 100 MG tablet Take 1 tablet (100 mg total) by mouth daily.  . busPIRone (BUSPAR) 15 MG tablet Take 1 tablet (15 mg total) by mouth 3 (three) times daily.  . flunisolide (NASAREL) 29 MCG/ACT (0.025%) nasal spray Place 2 sprays into the nose.  . gabapentin (NEURONTIN) 600 MG tablet Take 1 tablet (600 mg total) by mouth 2 (two) times daily.  Marland Kitchen levETIRAcetam (KEPPRA) 500 MG tablet Take 500 mg by mouth.  . Loratadine 10 MG CAPS Take by mouth. Reported on 08/07/2015  . PARoxetine (PAXIL) 40 MG tablet Take one tablet daily (  total).  . QUEtiapine (SEROQUEL) 100 MG tablet Take one and one half tablet. Total of  at night  . [DISCONTINUED] buPROPion (WELLBUTRIN) 100 MG tablet Take 1 tablet (100 mg total) by mouth daily.  . [DISCONTINUED] busPIRone (BUSPAR) 15 MG tablet Take 1 tablet (15 mg total) by mouth 2 (two) times daily.  . [DISCONTINUED] PARoxetine (PAXIL) 40 MG tablet Take one tablet daily (  total).   No facility-administered encounter medications on file as of  08/25/2015.    No results found for this or any previous visit (from the past 2160 hour(s)).  BP 120/70 mmHg  Pulse 91  Ht  (1.93 m)  Wt 275 lb (124.739 kg)  BMI 33.49 kg/m2  SpO2 96%   Review of Systems  Constitutional: Negative for fever.  Cardiovascular: Negative for chest pain and palpitations.  Skin: Negative for rash.  Neurological: Negative for tingling and tremors.  Psychiatric/Behavioral: Positive for depression. Negative for suicidal ideas and substance abuse.  The patient is nervous/anxious.     Mental Status Examination  Appearance: casual Alert: Yes Attention: fair  Cooperative: Yes Eye Contact: Fair Speech: normal tone Psychomotor Activity: Decreased Memory/Concentration: adequate Oriented: place, time/date and situation Mood: somewhat down and anxious Affect: Congruent Thought Processes and Associations: Coherent Fund of Knowledge: Fair Thought Content: Suicidal ideation and Homicidal ideation were denied. No hallucinations Insight: Fair Judgement: Fair  Diagnosis: Polysubstance dependence opiate dependence. Unspecified episodic mood disorder. Major depressive disorder. Generalized anxiety disorder.   Treatment Plan:    Depression :continue paxil and wellbutrin  qd for augmentation. Continue  therapy for depression.  continue Seroquel  150 mg at night.gabapentin  bid. Anxiety and stress related to court. Increase buspar  to tid. It also helps his agitation.   He has remained sober. Advised to call sponsor daily and attend NA   No Involuntary movements.  Drug dependence: remains sober  CPap helps at night.  Labs at primary care. Advised to get prolactin level, Hemoglobin A1c and lipid profile. Advised to follow up with primary care if he continues high dose of neurontin.  Reviewed sleep hygiene.    Pertinent Labs and Relevant Prior Notes reviewed. Medication Side effects, benefits and risks reviewed/discussed with Patient. Time given for patient to respond and asks questions regarding the Diagnosis and Medications. Safety concerns and to report to ER if suicidal or call 911. Relevant Medications refilled or called in to pharmacy. Discussed weight maintenance. He continues to go Gym  Follow up with Primary care provider in regards to Medical conditions.  Discussed to avail opportunity to consider or/and continue Individual therapy with Counselor. Greater than 50% of time was spend in counseling and  coordination of care with the patient.  Schedule for Follow up visit in  4  weeks or call in earlier as necessary.  Time spent: 25 minutes Thresa Ross, MD 08/25/2015

## 2015-09-12 ENCOUNTER — Ambulatory Visit (HOSPITAL_COMMUNITY): Payer: Self-pay | Admitting: Licensed Clinical Social Worker

## 2015-09-16 ENCOUNTER — Ambulatory Visit (INDEPENDENT_AMBULATORY_CARE_PROVIDER_SITE_OTHER): Payer: BLUE CROSS/BLUE SHIELD | Admitting: Psychiatry

## 2015-09-16 ENCOUNTER — Encounter (HOSPITAL_COMMUNITY): Payer: Self-pay | Admitting: Psychiatry

## 2015-09-16 VITALS — BP 126/70 | HR 74 | Ht 76.0 in | Wt 272.0 lb

## 2015-09-16 DIAGNOSIS — F4310 Post-traumatic stress disorder, unspecified: Secondary | ICD-10-CM

## 2015-09-16 DIAGNOSIS — F1920 Other psychoactive substance dependence, uncomplicated: Secondary | ICD-10-CM

## 2015-09-16 DIAGNOSIS — F0630 Mood disorder due to known physiological condition, unspecified: Secondary | ICD-10-CM | POA: Diagnosis not present

## 2015-09-16 DIAGNOSIS — F11288 Opioid dependence with other opioid-induced disorder: Secondary | ICD-10-CM

## 2015-09-16 DIAGNOSIS — F321 Major depressive disorder, single episode, moderate: Secondary | ICD-10-CM

## 2015-09-16 MED ORDER — BUSPIRONE HCL 15 MG PO TABS: 15.0000 mg | ORAL_TABLET | Freq: Three times a day (TID) | ORAL | Status: DC

## 2015-09-16 MED ORDER — PAROXETINE HCL 40 MG PO TABS: ORAL_TABLET | ORAL | Status: DC

## 2015-09-16 MED ORDER — BUPROPION HCL 100 MG PO TABS: 100.0000 mg | ORAL_TABLET | Freq: Every day | ORAL | Status: DC

## 2015-09-16 MED ORDER — GABAPENTIN 600 MG PO TABS: 600.0000 mg | ORAL_TABLET | Freq: Two times a day (BID) | ORAL | Status: DC

## 2015-09-16 MED ORDER — QUETIAPINE FUMARATE 100 MG PO TABS
ORAL_TABLET | ORAL | Status: DC
Start: 2015-09-16 — End: 2015-12-01

## 2015-09-16 NOTE — Progress Notes (Signed)
Patient ID: Ricky Foster, male   DOB: [birth year], 25 y.o.   MRN: [id number] [facility] Follow-up Outpatient Visit  Ricky Foster [id number] 24 y.o.  09/16/2015  Chief Complaint: Follow up for drug dependence and mood disorder.    History of Present Illness:   Patient returns for Medication Follow up and is diagnosed with OPiate dependence and unspecified mood disorder. Major depressive disorder. Anxiety  He was seen initially referred after he has been in a rehabilitation center for drug dependence mostly for opiates, Crystal and marijuana. He was doing reasonable last visit we adjusted some of the medications. Apparently after that visit he had to serve some jail time related to him and his  X girlfriend and conflict with another person who was giving them money later charged them of identity theft when he did not get the benefit out of from his girl friend. This is the version told by the patient.  Last visit we kept the Wellbutrin but he was still having anxiety because of the court issues and difficult the handling the stress. Possible diagnosis of Crohn's disease underweight and difficult time with the court system. Increased dose part last visit to 50 mg 3 times a day that is helping the anxiety less worried full he is able to deal with the stress and is not getting overwhelmed. Mood disorder: less overwhelmed. Not manic or hopeless. Craving for drugs has subsided Drug dependence: remains sober 18 months. Handling the craving and no relapse. Still feels but less animosity towards the ex-girlfriend .He continues take Seroquel at night for mood stability and sleep Sleep fluctuating depending upon stress. 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 Has had one seizure since last visit. 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  Depression severity. 6/10. 10 being no depression. worsened Timing: more so when by himself Duration; 6 years Context: jail stay  and drug use in past.  There is no associated symptoms of mania including racing thoughts excessive indulgence in activity or elevated mood.  Marland KitchenHe is attending NA classes and remains optimistic and regular in his followups.   Past Medical History  Diagnosis Date  . Low back pain   . Allergic rhinitis   . Seizure disorder (HCC)    Family History  Problem Relation Age of Onset  . Depression Maternal Aunt   . Alcohol abuse Maternal Uncle     Outpatient Encounter Prescriptions as of 09/16/2015  Medication Sig  . buPROPion (WELLBUTRIN) 100 MG tablet Take 1 tablet (100 mg total) by mouth daily.  . busPIRone (BUSPAR) 15 MG tablet Take 1 tablet (15 mg total) by mouth 3 (three) times daily.  . flunisolide (NASAREL) 29 MCG/ACT (0.025%) nasal spray Place 2 sprays into the nose.  . gabapentin (NEURONTIN) 600 MG tablet Take 1 tablet (600 mg total) by mouth 2 (two) times daily.  Marland Kitchen levETIRAcetam (KEPPRA) 500 MG tablet Take 500 mg by mouth.  . Loratadine 10 MG CAPS Take by mouth. Reported on 08/07/2015  . PARoxetine (PAXIL) 40 MG tablet Take one tablet daily (  total).  . QUEtiapine (SEROQUEL) 100 MG tablet Take one and one half tablet. Total of  at night  . [DISCONTINUED] buPROPion (WELLBUTRIN) 100 MG tablet Take 1 tablet (100 mg total) by mouth daily.  . [DISCONTINUED] busPIRone (BUSPAR) 15 MG tablet Take 1 tablet (15 mg total) by mouth 3 (three) times daily.  . [DISCONTINUED] gabapentin (NEURONTIN) 600 MG tablet Take 1 tablet (600 mg total) by mouth 2 (two)  times daily.  . [DISCONTINUED] PARoxetine (PAXIL) 40 MG tablet Take one tablet daily (  total).  . [DISCONTINUED] QUEtiapine (SEROQUEL) 100 MG tablet Take one and one half tablet. Total of  at night   No facility-administered encounter medications on file as of 09/16/2015.    No results found for this or any previous visit (from the past 2160 hour(s)).  BP 126/70 mmHg  Pulse 74  Ht  (1.93 m)  Wt 272 lb (123.378 kg)   BMI 33.12 kg/m2  SpO2 96%   Review of Systems  Constitutional: Negative for fever.  Cardiovascular: Negative for chest pain and palpitations.  Skin: Negative for itching and rash.  Neurological: Negative for tingling and tremors.  Psychiatric/Behavioral: Negative for suicidal ideas and substance abuse.    Mental Status Examination  Appearance: casual Alert: Yes Attention: fair  Cooperative: Yes Eye Contact: Fair Speech: normal tone Psychomotor Activity: Decreased Memory/Concentration: adequate Oriented: place, time/date and situation Mood: better and not dysphoric Affect: Congruent Thought Processes and Associations: Coherent Fund of Knowledge: Fair Thought Content: Suicidal ideation and Homicidal ideation were denied. No hallucinations Insight: Fair Judgement: Fair  Diagnosis: Polysubstance dependence opiate dependence. Unspecified episodic mood disorder. Major depressive disorder. Generalized anxiety disorder.   Treatment Plan:    Depression :continue paxil and wellbutrin  qd for augmentation. Continue  therapy for depression.  continue Seroquel  150 mg at night.gabapentin  bid. Anxiety and stress related to court. Continue  buspar  to tid. It also helped his agitation  He has remained sober. Advised to call sponsor daily and attend NA   No Involuntary movements.  Drug dependence: remains sober  CPap helps at night.  Smoker: half pack. Advised to use wellbutrin to quit, says he will work on it.  Advised to follow up with primary care if he continues high dose of neurontin.  Reviewed sleep hygiene.    Pertinent Labs and Relevant Prior Notes reviewed. Medication Side effects, benefits and risks reviewed/discussed with Patient. Time given for patient to respond and asks questions regarding the Diagnosis and Medications. Safety concerns and to report to ER if suicidal or call 911. Relevant Medications refills printed and given to him this time  . Discussed weight maintenance. He continues to go Gym  Follow up with Primary care provider in regards to Medical conditions.  Discussed to avail opportunity to consider or/and continue Individual therapy with Counselor. Greater than 50% of time was spend in counseling and coordination of care with the patient.  Schedule for Follow up visit in  8  weeks or call in earlier as necessary.  Time spent: 25 minutes Thresa Ross, MD 09/16/2015

## 2015-09-30 ENCOUNTER — Ambulatory Visit (INDEPENDENT_AMBULATORY_CARE_PROVIDER_SITE_OTHER): Payer: BLUE CROSS/BLUE SHIELD | Admitting: Licensed Clinical Social Worker

## 2015-09-30 DIAGNOSIS — F1921 Other psychoactive substance dependence, in remission: Secondary | ICD-10-CM

## 2015-09-30 DIAGNOSIS — F0630 Mood disorder due to known physiological condition, unspecified: Secondary | ICD-10-CM

## 2015-09-30 DIAGNOSIS — F4310 Post-traumatic stress disorder, unspecified: Secondary | ICD-10-CM

## 2015-09-30 NOTE — Progress Notes (Signed)
   THERAPIST PROGRESS NOTE  Session Time: 9:05am-10:00am  Participation Level: Active  Behavioral Response: Casual Drowsy Depressed  Type of Therapy: Individual Therapy   Treatment Goals addressed: Elevate mood and show evidence of usual energy, activities, and socialization  Interventions: Interpersonal effectiveness  Suicidal/Homicidal: Denied both  Therapist Interventions:   Assessed how patient has been coping with the unexpected deaths of 3 close friends to overdose.  Stressed the importance of making self-care a priority.  Encouraged him to set limits with offering support to others who are also grieving.   Continued developing a recovery plan.  Had patient list 5 things he can do on a daily basis to support his recovery.  Prompted him to identify triggers for relapse.  Helped him to create a plan for responding to each trigger he identified.  Introduced him to a tool called the Self-Monitoring of Feelings Form which he can use to record thoughts, feelings, and behavioral responses to situations that provoke distressing feelings.  Asked him to fill it out for homework.         Summary:      Reported an overall increase in mental health symptoms in the past few weeks. This coincides with the loss of 3 close friends to drug overdoses.  He did not occur simultaneously but within a span of a few weeks. Reported these friends had been addicted to heroin. Stated "I'm blaming myself somewhat. Asking myself, why did I introduced them to the stuff?"  Reported he chose not to attend any of their funerals because he anticipated it would be too distressing for him. Was able to acknowledge that ultimately it was their choices that led to their passing.  Reported he has had 5 people reach out to him for support as they are grieving these losses. Has lost sleep because he is taking time to console these people. Estimated he is only getting about 2 hours of sleep at night. Feels tired, worn out, like he  has no drive. He has also been having frequent nightmares. Did a good job of coming up with his recovery plan.  Indicated he is open to using the Self-Monitoring of Feelings Form.    Plan: Scheduled to return March 17th.  Will review homework.    Diagnosis: PTSD                         Mood Disoder    Darrin LuisSolomon, Sarah A, LCSW 09/30/2015

## 2015-10-10 ENCOUNTER — Ambulatory Visit (INDEPENDENT_AMBULATORY_CARE_PROVIDER_SITE_OTHER): Payer: BLUE CROSS/BLUE SHIELD | Admitting: Licensed Clinical Social Worker

## 2015-10-10 DIAGNOSIS — F1921 Other psychoactive substance dependence, in remission: Secondary | ICD-10-CM

## 2015-10-10 DIAGNOSIS — F4310 Post-traumatic stress disorder, unspecified: Secondary | ICD-10-CM

## 2015-10-10 DIAGNOSIS — F39 Unspecified mood [affective] disorder: Secondary | ICD-10-CM | POA: Diagnosis not present

## 2015-10-13 NOTE — Progress Notes (Signed)
   THERAPIST PROGRESS NOTE  Session Time: 3:05-4:00pm  Participation Level: Active  Behavioral Response: Casual Alert Depressed  Type of Therapy: Individual Therapy   Treatment Goals addressed: Elevate mood and show evidence of usual energy, activities, and socialization  Interventions: Develop a recovery plan  Suicidal/Homicidal: Denied both  Therapist Interventions:   Assisted patient with completing his recovery plan.  Had him identify circumstances in which he is at high risk of relapsing.  Helped him come up with a plan for what to do in those situations.  Prompted him to consider how he can tell when recovery is working.  Provided patient with a copy of the plan.        Summary:      Reported losing yet another friend to a drug overdose in the past couple of weeks.  Indicated that he is struggling to cope at this time.  He said, "I don't know whether I'm coming or going."  Acknowledged having an increase in urges to use.  Not sleeping well.  Noted he also experiences chronic pain ever since he got off drugs.   Worrying a lot about his grandmother.  Thinks his aunt may be overmedicating her and not making sure she is eating.  The aunt is power of attorney.    Was able to finish his recovery plan.  This is what was added today:  I'm in serious trouble when I: Miss meetings Am not getting enough sleep  Not talking to my sponsor each day When I'm having frequent and intense thoughts to harm others   When I'm in serious trouble I should: Set an earlier bedtime and stick to it, refrain from using electronics an hour before bedtime, listen to a relaxing recording Contact my sponsor right away Let parents know I'm struggling with thoughts to harm others, get in touch with my therapist  I know my recovery is working when I: Am going to work regularly Get positive feedback from people who know what I've been through Stay focused on making healthy choices       Plan: Next  time will focus on using the Self-Monitoring of Feelings Form.   Diagnosis: PTSD                         Mood Disoder    Darrin LuisSolomon, Sarah A, LCSW 10/10/2015

## 2015-10-16 ENCOUNTER — Ambulatory Visit (HOSPITAL_COMMUNITY): Payer: Self-pay | Admitting: Licensed Clinical Social Worker

## 2015-10-23 ENCOUNTER — Ambulatory Visit (HOSPITAL_COMMUNITY): Payer: Self-pay | Admitting: Licensed Clinical Social Worker

## 2015-11-06 ENCOUNTER — Ambulatory Visit (INDEPENDENT_AMBULATORY_CARE_PROVIDER_SITE_OTHER): Payer: BLUE CROSS/BLUE SHIELD | Admitting: Licensed Clinical Social Worker

## 2015-11-06 DIAGNOSIS — F0630 Mood disorder due to known physiological condition, unspecified: Secondary | ICD-10-CM

## 2015-11-06 DIAGNOSIS — F4310 Post-traumatic stress disorder, unspecified: Secondary | ICD-10-CM | POA: Diagnosis not present

## 2015-11-06 NOTE — Progress Notes (Signed)
   THERAPIST PROGRESS NOTE  Session Time: 11:10am-12:10pm  Participation Level: Active  Behavioral Response: Casual Alert Anxious  Type of Therapy: Individual Therapy   Treatment Goals addressed: Elevate mood and show evidence of usual energy, activities, and socialization  Interventions: Solution focused therapy  Suicidal/Homicidal: Denied both  Therapist Interventions:    Gathered information about significant events and changes in mood and functioning since last seen in mid-March. Helped patient explore pros and cons of making some changes in his life.        Summary:     Reported several significant sources of stress.  Talked about how he has been working a lot and feels uncomfortable about conditions in his work environment.  Indicated he doesn't agree with how his manager is conducting things.  Also uncomfortable with the fact that his recent ex girlfriend started working there.  Broke up with this girl about 3 weeks ago.  Found out she was seeing another guy and was dishonest about several things.   Reported having frequent crying spells, feeling lonely, isolating a lot, and barely sleeping.  Described how when he tries to sleep he has racing thoughts about all the stressful events going on in his life.   Reported that he has been actively using his recovery plan and found the strategies to be pretty helpful.  Specifically mentioned enjoying doing some yoga.   Expressed intentions of actively seeking another job.  Also talked about making plans to take a family vacation.  Acknowledged that being in a relationship does not need to be a priority right now.  He said, "I need to figure out who I am before I can be ready to be with someone else."  Talked about wanting to go back to school.  Interested in learning about how to become a Scientist, forensicdrug counselor.          Plan: Planning to call later this afternoon to schedule his next couple of appointments.  Understands that this therapist will  be going out on maternity leave in early May.  Indicated that he intends to continue therapy with the therapist covering this therapist's caseload during the weeks she is out.   Diagnosis: PTSD                         Mood Disoder    Darrin LuisSolomon, Sarah A, LCSW 11/06/2015

## 2015-11-10 ENCOUNTER — Ambulatory Visit (HOSPITAL_COMMUNITY): Payer: Self-pay | Admitting: Psychiatry

## 2015-11-14 ENCOUNTER — Ambulatory Visit (INDEPENDENT_AMBULATORY_CARE_PROVIDER_SITE_OTHER): Payer: BLUE CROSS/BLUE SHIELD | Admitting: Licensed Clinical Social Worker

## 2015-11-14 DIAGNOSIS — F4310 Post-traumatic stress disorder, unspecified: Secondary | ICD-10-CM

## 2015-11-14 DIAGNOSIS — F0630 Mood disorder due to known physiological condition, unspecified: Secondary | ICD-10-CM | POA: Diagnosis not present

## 2015-11-14 NOTE — Progress Notes (Signed)
   THERAPIST PROGRESS NOTE  Session Time: 11:00am-12:00pm  Participation Level: Active  Behavioral Response: Casual Alert Anxious  Type of Therapy: Family therapy (mom was present)  Treatment Goals addressed: Elevate mood and show evidence of usual energy, activities, and socialization  Interventions: Solution focused therapy  Suicidal/Homicidal: Denied both  Therapist Interventions:   Got an update on stress related to work.  Reaffirmed patient's instincts to quit his job.  Discussed pros and cons of making an official complaint to the corporate office about unethical practices that have been going on.  Reiterated that the stress coming from the job is not worth sacrificing his mental health.  Got confirmation from his mom that she intends to support him as he looks for alternative employment.       Summary:     Reported that he put in a 2 week notice at his job.  His boss approached him about it and convinced him to reconsider.  Patient continued working with hopes that he would see some changes in conditions, but that did not happen.  Has since been debating whether or not to quit.   By the end of the session he indicated that he intends to text his boss to let him know he will no longer be coming into work.  Expressed a belief that walking away from that situation will bring him a great sense of peace.  Mom indicated she is supportive of his plan.   Indicated he still intends to make an official complaint to corporate, but will hold off until tax time so that he can be more assured that his complaint will be anonymous and he won't have to worry about the possibility of retaliation from his boss.             Plan: Scheduled to return on 11/24/15.  This will be patient's last session with this therapist before she goes out on maternity leave.  Has scheduled to see the therapist coming in to cover this therapist's caseload until her return in early August.   Diagnosis: PTSD                   Mood Disoder    Marilu FavreSolomon, Sarah A, LCSW 11/14/2015

## 2015-11-24 ENCOUNTER — Ambulatory Visit (HOSPITAL_COMMUNITY): Payer: Self-pay | Admitting: Licensed Clinical Social Worker

## 2015-11-24 DIAGNOSIS — Z9889 Other specified postprocedural states: Secondary | ICD-10-CM | POA: Insufficient documentation

## 2015-12-01 ENCOUNTER — Ambulatory Visit (HOSPITAL_COMMUNITY): Payer: Self-pay | Admitting: Licensed Clinical Social Worker

## 2015-12-01 ENCOUNTER — Other Ambulatory Visit (HOSPITAL_COMMUNITY): Payer: Self-pay | Admitting: Psychiatry

## 2015-12-03 NOTE — Telephone Encounter (Signed)
Pt called for a refill for Seroquel 100mg . Per Dr.akhtar, medication refill is authorized for a 7 day supply. Pt is schedule for a f/u appt on 12/11/15. Refill was sent to pharmacy for Seroquel 100mg , #15. Please informed pt, no more refills will be issued if next appt is missed. Pt verbalizes understanding.

## 2015-12-08 ENCOUNTER — Ambulatory Visit (HOSPITAL_COMMUNITY): Payer: Self-pay | Admitting: Licensed Clinical Social Worker

## 2015-12-11 ENCOUNTER — Encounter (HOSPITAL_COMMUNITY): Payer: Self-pay | Admitting: Psychiatry

## 2015-12-11 ENCOUNTER — Ambulatory Visit (INDEPENDENT_AMBULATORY_CARE_PROVIDER_SITE_OTHER): Payer: BLUE CROSS/BLUE SHIELD | Admitting: Psychiatry

## 2015-12-11 DIAGNOSIS — F1921 Other psychoactive substance dependence, in remission: Secondary | ICD-10-CM | POA: Diagnosis not present

## 2015-12-11 DIAGNOSIS — F321 Major depressive disorder, single episode, moderate: Secondary | ICD-10-CM

## 2015-12-11 DIAGNOSIS — F11288 Opioid dependence with other opioid-induced disorder: Secondary | ICD-10-CM

## 2015-12-11 DIAGNOSIS — F0630 Mood disorder due to known physiological condition, unspecified: Secondary | ICD-10-CM | POA: Diagnosis not present

## 2015-12-11 DIAGNOSIS — F4310 Post-traumatic stress disorder, unspecified: Secondary | ICD-10-CM | POA: Diagnosis not present

## 2015-12-11 MED ORDER — PAROXETINE HCL 40 MG PO TABS: ORAL_TABLET | ORAL | Status: DC

## 2015-12-11 MED ORDER — GABAPENTIN 600 MG PO TABS: 600.0000 mg | ORAL_TABLET | Freq: Two times a day (BID) | ORAL | Status: DC

## 2015-12-11 MED ORDER — BUSPIRONE HCL 15 MG PO TABS: 15.0000 mg | ORAL_TABLET | Freq: Three times a day (TID) | ORAL | Status: DC

## 2015-12-11 MED ORDER — QUETIAPINE FUMARATE 200 MG PO TABS: ORAL_TABLET | ORAL | Status: DC

## 2015-12-11 NOTE — Progress Notes (Signed)
Patient ID: Marney DoctorBrandon Catala, male   DOB: 10/13/1990, 25 y.o.   MRN: [id number] [facility]  Marney DoctorBrandon Frid [id number] 24 y.o.  12/11/2015  Chief Complaint: Follow up for drug dependence and mood disorder.    History of Present Illness:   Patient returns for Medication Follow up and is diagnosed with OPiate dependence and unspecified mood disorder. Major depressive disorder. Anxiety  He was seen initially referred after he has been in a rehabilitation center for drug dependence mostly for opiates, Crystal and marijuana.  Patient has topics visit his last job. He also lost 2 friends who got back involved in methamphetamine and heroine. He is feeling down depressed a motivated but he continues to function in his next job. He feels down no craving and has not relapsed but feeling some guilt decreased motivation Anxiety: gets overwhelmed Mood disorder:overwhelmed. Not manic  Drug dependence: remains sober 18 months. Handling the craving and no relapse. Still feels but less animosity towards the ex-girlfriend .He continues take Seroquel at night for mood stability and sleep. Feel dose may need be adjusted  Sleep fluctuating depending upon stress. 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 Has had one seizure since last visit. 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  Depression severity. 5/10. 10 being no depression. worsened Timing: more so when by himself Duration; 6 years Context: jail stay and drug use in past.  There is no associated symptoms of mania including racing thoughts excessive indulgence in activity or elevated mood.  Marland Kitchen.He is attending NA classes and remains optimistic and regular in his followups.   Past Medical History  Diagnosis Date  . Low back pain   . Allergic rhinitis   . Seizure disorder (HCC)    Family History  Problem Relation Age of Onset  . Depression Maternal Aunt   . Alcohol abuse Maternal Uncle      Outpatient Encounter Prescriptions as of 12/11/2015  Medication Sig  . buPROPion (WELLBUTRIN) 100 MG tablet Take 1 tablet (100 mg total) by mouth daily.  . busPIRone (BUSPAR) 15 MG tablet Take 1 tablet (15 mg total) by mouth 3 (three) times daily.  . flunisolide (NASAREL) 29 MCG/ACT (0.025%) nasal spray Place 2 sprays into the nose.  . gabapentin (NEURONTIN) 600 MG tablet Take 1 tablet (600 mg total) by mouth 2 (two) times daily.  Marland Kitchen. levETIRAcetam (KEPPRA) 500 MG tablet Take 500 mg by mouth.  . Loratadine 10 MG CAPS Take by mouth. Reported on 08/07/2015  . PARoxetine (PAXIL) 40 MG tablet Take one tablet daily (40mg  total).  . QUEtiapine (SEROQUEL) 200 MG tablet take 1 tab of 200mg  at night  . [DISCONTINUED] busPIRone (BUSPAR) 15 MG tablet Take 1 tablet (15 mg total) by mouth 3 (three) times daily.  . [DISCONTINUED] gabapentin (NEURONTIN) 600 MG tablet Take 1 tablet (600 mg total) by mouth 2 (two) times daily.  . [DISCONTINUED] PARoxetine (PAXIL) 40 MG tablet Take one tablet daily (40mg  total).  . [DISCONTINUED] QUEtiapine (SEROQUEL) 100 MG tablet take 1 and 1/2 tablet by mouth AT NIGHT   No facility-administered encounter medications on file as of 12/11/2015.    No results found for this or any previous visit (from the past 2160 hour(s)).  There were no vitals taken for this visit.   Review of Systems  Constitutional: Negative for fever.  Cardiovascular: Negative for chest pain and palpitations.  Musculoskeletal: Negative for neck pain.  Skin: Negative for itching and rash.  Neurological: Negative for  tingling and tremors.  Psychiatric/Behavioral: Negative for suicidal ideas and substance abuse.    Mental Status Examination  Appearance: casual Alert: Yes Attention: fair  Cooperative: Yes Eye Contact: Fair Speech: normal tone Psychomotor Activity: Decreased Memory/Concentration: adequate Oriented: place, time/date and situation Mood: t dysphoric Affect: Congruent Thought  Processes and Associations: Coherent Fund of Knowledge: Fair Thought Content: Suicidal ideation and Homicidal ideation were denied. No hallucinations Insight: Fair Judgement: Fair  Diagnosis: Polysubstance dependence opiate dependence. Unspecified episodic mood disorder. Major depressive disorder. Generalized anxiety disorder.   Treatment Plan:    Depression : worsened. Will increase seroquel to  qhs.  continue paxil and wellbutrin  qd for augmentation. Will not increase wellbutrin as he had agitation on higher dose.  Continue  therapy for depression.  continue  gabapentin  bid. Prescriptions printed.  Anxiety and stress related to court. Continue  buspar  to tid. It also helped his agitation in past.  He has remained sober. Advised to call sponsor daily and attend NA   No Involuntary movements.  Drug dependence: remains sober  CPap helps at night.  Smoker: half pack. Advised to use wellbutrin to quit, says he will work on it.  Advised to follow up with primary care if he continues high dose of neurontin.  Reviewed sleep hygiene.   . Discussed weight maintenance. He continues to go Gym  Follow up with Primary care provider in regards to Medical conditions.  Discussed to avail opportunity to consider or/and continue Individual therapy with Counselor. That should help considering loosing friends.  Greater than 50% of time was spend in counseling and coordination of care with the patient.  Schedule for Follow up visit in  4  weeks or call in earlier as necessary.  Time spent: 25 minutes Thresa Ross, MD 12/11/2015

## 2015-12-16 ENCOUNTER — Ambulatory Visit (HOSPITAL_COMMUNITY): Payer: Self-pay | Admitting: Licensed Clinical Social Worker

## 2016-01-12 ENCOUNTER — Ambulatory Visit (HOSPITAL_COMMUNITY): Payer: Self-pay | Admitting: Psychiatry

## 2016-01-13 ENCOUNTER — Encounter (HOSPITAL_COMMUNITY): Payer: Self-pay | Admitting: Psychiatry

## 2016-01-13 ENCOUNTER — Ambulatory Visit (INDEPENDENT_AMBULATORY_CARE_PROVIDER_SITE_OTHER): Payer: BLUE CROSS/BLUE SHIELD | Admitting: Psychiatry

## 2016-01-13 VITALS — BP 124/76 | HR 100 | Ht 76.0 in | Wt 281.0 lb

## 2016-01-13 DIAGNOSIS — F4310 Post-traumatic stress disorder, unspecified: Secondary | ICD-10-CM | POA: Diagnosis not present

## 2016-01-13 DIAGNOSIS — F0630 Mood disorder due to known physiological condition, unspecified: Secondary | ICD-10-CM | POA: Diagnosis not present

## 2016-01-13 DIAGNOSIS — F1921 Other psychoactive substance dependence, in remission: Secondary | ICD-10-CM

## 2016-01-13 DIAGNOSIS — F321 Major depressive disorder, single episode, moderate: Secondary | ICD-10-CM

## 2016-01-13 DIAGNOSIS — F11288 Opioid dependence with other opioid-induced disorder: Secondary | ICD-10-CM | POA: Diagnosis not present

## 2016-01-13 MED ORDER — BUPROPION HCL 100 MG PO TABS: 100.0000 mg | ORAL_TABLET | Freq: Every day | ORAL | Status: DC

## 2016-01-13 MED ORDER — PAROXETINE HCL 40 MG PO TABS: ORAL_TABLET | ORAL | Status: DC

## 2016-01-13 MED ORDER — GABAPENTIN 600 MG PO TABS: 600.0000 mg | ORAL_TABLET | Freq: Two times a day (BID) | ORAL | Status: DC

## 2016-01-13 MED ORDER — QUETIAPINE FUMARATE 200 MG PO TABS: ORAL_TABLET | ORAL | Status: DC

## 2016-01-13 MED ORDER — BUSPIRONE HCL 15 MG PO TABS: 15.0000 mg | ORAL_TABLET | Freq: Three times a day (TID) | ORAL | Status: DC

## 2016-01-13 NOTE — Progress Notes (Signed)
Patient ID: Ricky Foster, male   DOB: 01-22-1991, 25 y.o.   MRN: [id number] [facility] Follow-up Outpatient Visit  Zacharey Jensen [id number] 24 y.o.  01/13/2016  Chief Complaint: Follow up for drug dependence and mood disorder.    History of Present Illness:   Patient returns for Medication Follow up and is diagnosed with OPiate dependence and unspecified mood disorder. Major depressive disorder. Anxiety  He was seen initially referred after he has been in a rehabilitation center for drug dependence mostly for opiates, Crystal and marijuana.   Last visit he was feeling dysphoric and also her sleep increase the Seroquel that is abnormal symptoms and is sleeping better. But he still feels down at times feels emotional . History and to keep himself busy with outdoor work and helps his mother Has not relapsed. Legal issues are being settled down and he is not bothered too much about it Feels aches and pain on the body says that Neurontin may need to be increased so we talked about possibility of sending him to a provider or neurologist as I'm trying to give Neurontin as a most of mother not as a pain medication Anxiety: gets overwhelmed under stress Mood disorder:overwhelmed. Not manic  Drug dependence: remains sober 18 months. Handling the craving and no relapse. Still feels but less animosity towards the ex-girlfriend .  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 Has had one seizure since last visit. 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  Depression severity. 6/10. 10 being no depression. Somewhat better Timing: more so when by himself Duration; 6 years Context: jail stay and drug use in past.  There is no associated symptoms of mania including racing thoughts excessive indulgence in activity or elevated mood.  Marland KitchenHe is attending NA classes and remains optimistic and regular in his followups.   Past Medical History  Diagnosis Date  . Low back pain    . Allergic rhinitis   . Seizure disorder (HCC)    Family History  Problem Relation Age of Onset  . Depression Maternal Aunt   . Alcohol abuse Maternal Uncle     Outpatient Encounter Prescriptions as of 01/13/2016  Medication Sig  . buPROPion (WELLBUTRIN) 100 MG tablet Take 1 tablet (100 mg total) by mouth daily.  . busPIRone (BUSPAR) 15 MG tablet Take 1 tablet (15 mg total) by mouth 3 (three) times daily.  . flunisolide (NASAREL) 29 MCG/ACT (0.025%) nasal spray Place 2 sprays into the nose.  . gabapentin (NEURONTIN) 600 MG tablet Take 1 tablet (600 mg total) by mouth 2 (two) times daily.  Marland Kitchen levETIRAcetam (KEPPRA) 500 MG tablet Take 500 mg by mouth.  . Loratadine 10 MG CAPS Take by mouth. Reported on 08/07/2015  . PARoxetine (PAXIL) 40 MG tablet Take one tablet daily (  total).  . QUEtiapine (SEROQUEL) 200 MG tablet take 1 tab of  at night  . [DISCONTINUED] buPROPion (WELLBUTRIN) 100 MG tablet Take 1 tablet (100 mg total) by mouth daily.  . [DISCONTINUED] busPIRone (BUSPAR) 15 MG tablet Take 1 tablet (15 mg total) by mouth 3 (three) times daily.  . [DISCONTINUED] gabapentin (NEURONTIN) 600 MG tablet Take 1 tablet (600 mg total) by mouth 2 (two) times daily.  . [DISCONTINUED] PARoxetine (PAXIL) 40 MG tablet Take one tablet daily (  total).  . [DISCONTINUED] QUEtiapine (SEROQUEL) 200 MG tablet take 1 tab of  at night   No facility-administered encounter medications on file as of 01/13/2016.    No results found for this  or any previous visit (from the past 2160 hour(s)).  BP 124/76 mmHg  Pulse 100  Ht 6\' 4"  (1.93 m)  Wt 281 lb (127.461 kg)  BMI 34.22 kg/m2  SpO2 96%   Review of Systems  Constitutional: Negative for fever.  Cardiovascular: Negative for chest pain and palpitations.  Musculoskeletal: Negative for neck pain.  Skin: Negative for itching and rash.  Neurological: Negative for tingling and tremors.  Psychiatric/Behavioral: Negative for suicidal ideas  and substance abuse. The patient does not have insomnia.     Mental Status Examination  Appearance: casual Alert: Yes Attention: fair  Cooperative: Yes Eye Contact: Fair Speech: normal tone Psychomotor Activity: Decreased Memory/Concentration: adequate Oriented: place, time/date and situation Mood: less dysphoric Affect: Congruent Thought Processes and Associations: Coherent Fund of Knowledge: Fair Thought Content: Suicidal ideation and Homicidal ideation were denied. No hallucinations Insight: Fair Judgement: Fair  Diagnosis: Polysubstance dependence opiate dependence. Unspecified episodic mood disorder. Major depressive disorder. Generalized anxiety disorder.   Treatment Plan:    Depression :somewhat better, continue seroquel to 200mg  qhs. He feels edgy at times. But sleep has helped some of mood symptoms.  continue paxil and wellbutrin 100mg  qd for augmentation. Will not increase wellbutrin as he had agitation on higher dose.  Continue  therapy for depression.  continue  gabapentin 600mg  bid. Refer to primary care if gaba needs to be increased for aches and pain Prescriptions printed.  Anxiety and stress related to court. Continue  buspar 15mg  to tid. It also helped his agitation in past.  He has remained sober. Advised to call sponsor daily and attend NA   No Involuntary movements.  Drug dependence: remains sober  CPap helps at night.   Advised to follow up with primary care if he continues high dose of neurontin.  Reviewed sleep hygiene.   . Discussed weight maintenance. He continues to go Gym  Follow up with Primary care provider in regards to Medical conditions.  Discussed to avail opportunity to consider or/and continue Individual therapy with Counselor. That should help considering loosing friends.  Greater than 50% of time was spend in counseling and coordination of care with the patient.  Schedule for Follow up visit in  4 6  weeks or call in earlier as  necessary.  Time spent: 25 minutes [name], Zalan Shidler, MD 01/13/2016

## 2016-02-04 ENCOUNTER — Other Ambulatory Visit (HOSPITAL_COMMUNITY): Payer: Self-pay | Admitting: Psychiatry

## 2016-02-09 ENCOUNTER — Ambulatory Visit (INDEPENDENT_AMBULATORY_CARE_PROVIDER_SITE_OTHER): Payer: BLUE CROSS/BLUE SHIELD | Admitting: Licensed Clinical Social Worker

## 2016-02-09 ENCOUNTER — Ambulatory Visit (HOSPITAL_COMMUNITY): Payer: Self-pay | Admitting: Licensed Clinical Social Worker

## 2016-02-09 DIAGNOSIS — F4310 Post-traumatic stress disorder, unspecified: Secondary | ICD-10-CM

## 2016-02-09 DIAGNOSIS — F0630 Mood disorder due to known physiological condition, unspecified: Secondary | ICD-10-CM | POA: Diagnosis not present

## 2016-02-09 NOTE — Telephone Encounter (Signed)
[facility] and Seroquel. Per Dr. Gilmore LarocheAkhtar, refill requests are denied. Pt has request refill too early. Called and spoke w/ pt, pt states he has a refill at pharmacy. Nothing is needed at this time. Pt is schedule for a f/u appt on 8/9. Pt shows understanding.
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
## 2016-02-16 ENCOUNTER — Ambulatory Visit (HOSPITAL_COMMUNITY): Payer: Self-pay | Admitting: Licensed Clinical Social Worker

## 2016-02-17 ENCOUNTER — Ambulatory Visit (INDEPENDENT_AMBULATORY_CARE_PROVIDER_SITE_OTHER): Payer: BLUE CROSS/BLUE SHIELD | Admitting: Licensed Clinical Social Worker

## 2016-02-17 DIAGNOSIS — F4310 Post-traumatic stress disorder, unspecified: Secondary | ICD-10-CM

## 2016-02-17 DIAGNOSIS — F0630 Mood disorder due to known physiological condition, unspecified: Secondary | ICD-10-CM

## 2016-02-17 NOTE — Progress Notes (Signed)
   THERAPIST PROGRESS NOTE  Session Time: 11:00am-12:00pm  Participation Level: Active  Behavioral Response: Casual Alert Euthymic  Type of Therapy: Individual therapy   Treatment Goals addressed: Elevate mood and show evidence of usual energy, activities, and socialization  Interventions: Review of treatment plan, tips for coping with nightmares  Suicidal/Homicidal: Denied both  Therapist Interventions:   Reviewed treatment plan.  Identified signs of progress as well as things that still need to be worked on.  Collaboratively decided to keep working on the same treatment goals.   Talked about how patient can cope with frequent nightmares.  Encouraged him to adopt an attitude of acceptance about them and trust that they will diminish over time as he heals from trauma.  Also encouraged him to identify things he can do in the moment to regain a sense of safety (talk to his parents or other support person, listen to relaxing music, mindfulness exercise, etc).     Summary:  Reported that he accepted a plea deal and doing so has "taken a huge load off."   Once again reported losing another friend to overdose.  After reviewing his treatment goals he said, "There's progress but there are a lot of things that need more work."  Acknowledged that he has become better at recognizing his symptoms and likely reasons for those symptoms to be present.  Has been using mindfulness and other techniques for relaxation with some success.  Noted that he has not had any seizures in 7 months.  Expressed a belief that the seizures have been triggered in the past by excessive anxiety.  Acknowledged that he has been isolating less and has started socializing with some new friends.   Reported that nightmares are interfering greatly with sleep.  Described them as very realistic.  Some of the dreams are reoccurring.  Tends to wake up in a panic, terrified and consumed with guilt . Expressed appreciation for the  recommendations for coping with nightmares.           Plan: Return in approximately one week.  Will expand on stabilization techniques.    Diagnosis: PTSD                         Mood Disorder Unspecified    Ricky Foster 02/17/2016

## 2016-02-25 ENCOUNTER — Ambulatory Visit (INDEPENDENT_AMBULATORY_CARE_PROVIDER_SITE_OTHER): Payer: BLUE CROSS/BLUE SHIELD | Admitting: Licensed Clinical Social Worker

## 2016-02-25 DIAGNOSIS — F0630 Mood disorder due to known physiological condition, unspecified: Secondary | ICD-10-CM | POA: Diagnosis not present

## 2016-02-25 DIAGNOSIS — F4310 Post-traumatic stress disorder, unspecified: Secondary | ICD-10-CM

## 2016-02-25 NOTE — Progress Notes (Signed)
   THERAPIST PROGRESS NOTE  Session Time: 10:00am- 11:00am  Participation Level: Active  Behavioral Response: Casual Drowsy  Euthymic  Type of Therapy: Individual therapy   Treatment Goals addressed: Elevate mood and show evidence of usual energy, activities, and socialization  Interventions: Interpersonal effectiveness  Suicidal/Homicidal: Denied both  Therapist Interventions:   Discussed how patient has developed a greater sense of contentment with his life.  Prompted him to identify what has helped him to achieve that satisfaction. Processed thoughts and feelings about two of his current life stressors.  Offered suggestions for how to communicate his thoughts and feelings to others in a respectful way to eliminate conflict.  Discussed how these stressors remind him of unpleasant memories regarding how he used to act.   Therapist shared observation that he appeared tired.  Discussed how he might approach his boss about getting a day off to rest.   Summary:  Talked about how he feels he has been able to take on a more positive perspective about stressors in his life.  Noted that taking walks and listening to encouraging music has also been helpful.  Described how he has taken the time to reevaluate his friend circle and cut out or reduce interactions with individuals who bring him down. Indicated he is open to therapist's suggestions for communication saying "I think they will help make coping with these situations not so hard to face."  Expressed intentions of calling his grandmother more often rather than continue to avoid it because of the likelihood of having to talk to his aunt as well.    Explained that he has not been getting a lot of sleep lately.  Past two nights his neighbor had people over and they were loud.  Has been working 6 out of 7 days per week.       Plan: Return in approximately one week.  Will expand on stabilization techniques.    Diagnosis: PTSD             Mood Disorder Unspecified    Darrin Luis 02/25/2016

## 2016-03-03 ENCOUNTER — Ambulatory Visit (INDEPENDENT_AMBULATORY_CARE_PROVIDER_SITE_OTHER): Payer: BLUE CROSS/BLUE SHIELD | Admitting: Psychiatry

## 2016-03-03 ENCOUNTER — Encounter (HOSPITAL_COMMUNITY): Payer: Self-pay | Admitting: Psychiatry

## 2016-03-03 DIAGNOSIS — F321 Major depressive disorder, single episode, moderate: Secondary | ICD-10-CM

## 2016-03-03 DIAGNOSIS — F4310 Post-traumatic stress disorder, unspecified: Secondary | ICD-10-CM | POA: Diagnosis not present

## 2016-03-03 DIAGNOSIS — F0630 Mood disorder due to known physiological condition, unspecified: Secondary | ICD-10-CM

## 2016-03-03 MED ORDER — GABAPENTIN 600 MG PO TABS: 600.0000 mg | ORAL_TABLET | Freq: Two times a day (BID) | ORAL | 1 refills | Status: DC

## 2016-03-03 MED ORDER — BUSPIRONE HCL 15 MG PO TABS: 15.0000 mg | ORAL_TABLET | Freq: Three times a day (TID) | ORAL | 1 refills | Status: DC

## 2016-03-03 MED ORDER — PAROXETINE HCL 40 MG PO TABS: ORAL_TABLET | ORAL | 1 refills | Status: DC

## 2016-03-03 MED ORDER — BUPROPION HCL 100 MG PO TABS: 100.0000 mg | ORAL_TABLET | Freq: Every day | ORAL | 1 refills | Status: DC

## 2016-03-03 MED ORDER — QUETIAPINE FUMARATE 200 MG PO TABS: ORAL_TABLET | ORAL | 1 refills | Status: DC

## 2016-03-03 NOTE — Progress Notes (Signed)
Patient ID: Ricky Foster, male   DOB: [birth year], 25 y.o.   MRN: [id number] [facility] Follow-up Outpatient Visit  Ricky Foster [id number] 25 y.o.  03/03/2016  Chief Complaint: Follow up for drug dependence and mood disorder.    History of Present Illness:   Patient returns for Medication Follow up and is diagnosed with OPiate dependence and unspecified mood disorder. Major depressive disorder. Anxiety  He was seen initially referred after he has been in a rehabilitation center for drug dependence mostly for opiates, Crystal and marijuana.   Last visit he was feeling agitated and some considerable sleep so we increased the Seroquel to 200 mg that is helped He is liking his job. Legal issue doesn't bother him much and he is more positive.   Neurontin has helped his aches and  pain . Anxiety: gets overwhelmed under stress Mood disorder:not overwhelmed Drug dependence: remains sober 18 months. Handling the craving and no relapse. Still feels but less animosity towards the ex-girlfriend .  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 Has had one seizure since last visit. 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  Depression severity. 6/10. 10 being no depression. Somewhat better Timing: more so when by himself Duration; 6 years Context: jail stay and drug use in past.  There is no associated symptoms of mania including racing thoughts excessive indulgence in activity or elevated mood.  Marland KitchenHe is attending NA classes and remains optimistic and regular in his followups.   Past Medical History:  Diagnosis Date  . Allergic rhinitis   . Low back pain   . Seizure disorder (HCC)    Family History  Problem Relation Age of Onset  . Depression Maternal Aunt   . Alcohol abuse Maternal Uncle     Outpatient Encounter Prescriptions as of 03/03/2016  Medication Sig  . buPROPion (WELLBUTRIN) 100 MG tablet Take 1 tablet (100 mg total) by mouth daily.  .  busPIRone (BUSPAR) 15 MG tablet Take 1 tablet (15 mg total) by mouth 3 (three) times daily.  . flunisolide (NASAREL) 29 MCG/ACT (0.025%) nasal spray Place 2 sprays into the nose.  . gabapentin (NEURONTIN) 600 MG tablet Take 1 tablet (600 mg total) by mouth 2 (two) times daily.  Marland Kitchen levETIRAcetam (KEPPRA) 500 MG tablet Take 500 mg by mouth.  . Loratadine 10 MG CAPS Take by mouth. Reported on 08/07/2015  . PARoxetine (PAXIL) 40 MG tablet Take one tablet daily (  total).  . QUEtiapine (SEROQUEL) 200 MG tablet take 1 tab of  at night  . [DISCONTINUED] buPROPion (WELLBUTRIN) 100 MG tablet Take 1 tablet (100 mg total) by mouth daily.  . [DISCONTINUED] busPIRone (BUSPAR) 15 MG tablet Take 1 tablet (15 mg total) by mouth 3 (three) times daily.  . [DISCONTINUED] gabapentin (NEURONTIN) 600 MG tablet Take 1 tablet (600 mg total) by mouth 2 (two) times daily.  . [DISCONTINUED] PARoxetine (PAXIL) 40 MG tablet Take one tablet daily (  total).  . [DISCONTINUED] QUEtiapine (SEROQUEL) 200 MG tablet take 1 tab of  at night   No facility-administered encounter medications on file as of 03/03/2016.     No results found for this or any previous visit (from the past 2160 hour(s)).  BP 128/72 (BP Location: Right Arm, Patient Position: Sitting, Cuff Size: Normal)   Pulse 83   Ht  (1.93 m)   Wt 282 lb (127.9 kg)   SpO2 95%   BMI 34.33 kg/m    Review of Systems  Constitutional: Negative for  fever.  Cardiovascular: Negative for chest pain.  Musculoskeletal: Negative for falls.  Skin: Negative for rash.  Neurological: Negative for tingling and tremors.  Psychiatric/Behavioral: Negative for substance abuse and suicidal ideas. The patient does not have insomnia.     Mental Status Examination  Appearance: casual Alert: Yes Attention: fair  Cooperative: Yes Eye Contact: Fair Speech: normal tone Psychomotor Activity: Decreased Memory/Concentration: adequate Oriented: place, time/date and  situation Mood: improved Affect: Congruent Thought Processes and Associations: Coherent Fund of Knowledge: Fair Thought Content: Suicidal ideation and Homicidal ideation were denied. No hallucinations Insight: Fair Judgement: Fair  Diagnosis: Polysubstance dependence opiate dependence. Unspecified episodic mood disorder. Major depressive disorder. Generalized anxiety disorder.   Treatment Plan:    Depression :somewhat better, continue seroquel 200mg  qhs. Less edgy. continue paxil and wellbutrin 100mg  qd for augmentation. Will not increase wellbutrin as he had agitation on higher dose.  Continue  therapy for depression.  continue  gabapentin 600mg  bid. Refer to primary care if gaba needs to be increased for aches and pain Prescriptions printed.  Anxiety and stress related to court. Continue  buspar 15mg  to tid. It also helped his agitation in past.  He has remained sober. Advised to call sponsor daily and attend NA   No Involuntary movements.  Drug dependence: remains sober  CPap helps at night.   Advised to follow up with primary care if he continues high dose of neurontin.  Reviewed sleep hygiene.   . Discussed weight maintenance. He continues to go Gym  Follow up with Primary care provider in regards to Medical conditions.  Continue therapy with SArah. Greater than 50% of time was spend in counseling and coordination of care with the patient.  Schedule for Follow up visit in  4 6  weeks or call in earlier as necessary.  Time spent: 25 minutes [name], Kohei Antonellis, MD

## 2016-03-04 ENCOUNTER — Ambulatory Visit (HOSPITAL_COMMUNITY): Payer: Self-pay | Admitting: Licensed Clinical Social Worker

## 2016-03-04 ENCOUNTER — Ambulatory Visit (INDEPENDENT_AMBULATORY_CARE_PROVIDER_SITE_OTHER): Payer: BLUE CROSS/BLUE SHIELD | Admitting: Licensed Clinical Social Worker

## 2016-03-04 DIAGNOSIS — F0630 Mood disorder due to known physiological condition, unspecified: Secondary | ICD-10-CM

## 2016-03-04 DIAGNOSIS — F4310 Post-traumatic stress disorder, unspecified: Secondary | ICD-10-CM

## 2016-03-04 NOTE — Progress Notes (Signed)
   THERAPIST PROGRESS NOTE  Session Time: 2:10pm- 3:05pm  Participation Level: Active  Behavioral Response: Casual Drowsy Frustrated   Type of Therapy: Individual therapy   Treatment Goals addressed: Elevate mood and show evidence of usual energy, activities, and socialization  Interventions: CBT and Interpersonal effectiveness  Suicidal/Homicidal: Denied both  Therapist Interventions:   Talked about how patient is exhausted and cranky and how he attributes this to his job.  Reframed the situation as being a learning experience and not a failure.   Provided positive feedback regarding how he plans to communicate his thoughts and feelings.    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  It is affecting his sleep, testing his patience, and leaving him feeling angry much of the time.  Reported that yesterday when he finally had a day off he took some time to consider his options.  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   Planning to open up about this decision with his parents.     Was able to alter his perspective away from seeing the situation as a failure saying,"I gave it an honest attempt.  I realize it's just not for me."  Lost yet another friend to an overdose.  They had been talking daily up until a week before he died.           Plan: Return in approximately one week.  Will have patient complete a Brief Trauma Questionnaire and PCL-5 to assess severity of PTSD symptoms.   Diagnosis: PTSD                         Mood Disorder Unspecified    Ricky Foster, Sarah A, LCSW 03/04/2016

## 2016-03-11 ENCOUNTER — Ambulatory Visit (INDEPENDENT_AMBULATORY_CARE_PROVIDER_SITE_OTHER): Payer: BLUE CROSS/BLUE SHIELD | Admitting: Licensed Clinical Social Worker

## 2016-03-11 DIAGNOSIS — F4310 Post-traumatic stress disorder, unspecified: Secondary | ICD-10-CM | POA: Diagnosis not present

## 2016-03-11 DIAGNOSIS — F0630 Mood disorder due to known physiological condition, unspecified: Secondary | ICD-10-CM | POA: Diagnosis not present

## 2016-03-11 NOTE — Progress Notes (Signed)
   THERAPIST PROGRESS NOTE  Session Time: 10:05am-11:00am  Participation Level: Active  Behavioral Response: Casual Alert Euthymic    Type of Therapy: Individual therapy   Treatment Goals addressed: Elevate mood and show evidence of usual energy, activities, and socialization  Interventions: Psycho-ed about PTSD  Suicidal/Homicidal: Denied both  Therapist Interventions:   Discussed patient's experience communicating his thoughts and feelings to his manager at work. Had patient complete a Brief Trauma Questionnaire and PCL-5 to assess severity of PTSD symptoms. Reviewed symptoms of PTSD.  Explained that it is normal to have a wide variety of difficulties after experiencing a traumatic event.  Noted that a large part of treatment involves learning strategies to recognize triggers and use techniques aimed to reverse the fight or flight response.     Summary:  Reported feeling proud of himself for opening up to his Production designer, theatre/television/filmmanager.  It resulted in a change in his schedule to have two days off per week and getting words of encouragement from his manager.  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   Results of the Brief Trauma Questionnaire indicated that patient has experienced a wide variety of traumatic events starting in childhood.  Acknowledged feeling a combination of sadness, anger, and disappointment upon reflecting on these experiences. Score on the PCL-5 was 63.  Patient endorsed symptoms in all areas.  Indicated he could relate very well to the symptoms.      Plan: Return in approximately one week.  May educate about how PTSD changes the way the brain processes information.     Diagnosis: PTSD                         Mood Disorder Unspecified    Darrin LuisSolomon, Ahmad Vanwey A, LCSW 03/11/2016

## 2016-03-17 ENCOUNTER — Ambulatory Visit (INDEPENDENT_AMBULATORY_CARE_PROVIDER_SITE_OTHER): Payer: BLUE CROSS/BLUE SHIELD | Admitting: Licensed Clinical Social Worker

## 2016-03-17 DIAGNOSIS — F4310 Post-traumatic stress disorder, unspecified: Secondary | ICD-10-CM | POA: Diagnosis not present

## 2016-03-17 DIAGNOSIS — F0630 Mood disorder due to known physiological condition, unspecified: Secondary | ICD-10-CM | POA: Diagnosis not present

## 2016-03-17 NOTE — Progress Notes (Signed)
   THERAPIST PROGRESS NOTE  Session Time: 2:05pm- 3:00pm  Participation Level: Active  Behavioral Response: Casual Alert Euthymic    Type of Therapy: Individual therapy   Treatment Goals addressed: Elevate mood and show evidence of usual energy, activities, and socialization  Interventions: Psycho-ed about PTSD  Suicidal/Homicidal: Denied both  Therapist Interventions:   Discussed how patient opened up to his parents about traumatic experiences he has been through and how that affected him. Educated patient about how the brain operates differently for people with PTSD than those who have not experienced a lot of trauma.     Summary:  Told parents about some traumatic events they were unaware of before.  Their response was supportive.  Talked about feeling a sense of relief after opening up.   He noted "I have felt better since the last session.  Acknowledged that it was painful to think about and talk about traumatic experiences but afterwards felt some relief.   Expressed appreciation for therapist taking time to explain the PTSD brain.  Gave him some insight into why he tends to respond the way he does.      Plan: Return in approximately one week.  Will focus on learning coping skills for deactivating the amydala.    Diagnosis: PTSD                         Mood Disorder Unspecified    Darrin LuisSolomon, Sarah A, LCSW 03/17/2016

## 2016-03-23 ENCOUNTER — Ambulatory Visit (INDEPENDENT_AMBULATORY_CARE_PROVIDER_SITE_OTHER): Payer: BLUE CROSS/BLUE SHIELD | Admitting: Licensed Clinical Social Worker

## 2016-03-23 DIAGNOSIS — F0630 Mood disorder due to known physiological condition, unspecified: Secondary | ICD-10-CM | POA: Diagnosis not present

## 2016-03-23 DIAGNOSIS — F4310 Post-traumatic stress disorder, unspecified: Secondary | ICD-10-CM | POA: Diagnosis not present

## 2016-03-23 NOTE — Progress Notes (Signed)
   THERAPIST PROGRESS NOTE  Session Time: 1:05pm- 2:00pm  Participation Level: Active  Behavioral Response: Casual Alert Euthymic    Type of Therapy: Individual therapy   Treatment Goals addressed: Elevate mood and show evidence of usual energy, activities, and socialization  Interventions: Relaxation training  Suicidal/Homicidal: Denied both  Therapist Interventions:   Taught patient a specific exercise called the 4-7-8 Breath.  Watched a video of someone demonstrating the technique.  Had patient practice it.  Recommended practicing the exercise at least twice a day.   Taught another relaxation exercise called Progressive Muscle Relaxation.  Prompted him to tighten and release different muscle groups.     Summary:  Reported that he is noticing much more often when his body is in a state of fight or flight.  Estimated that he spends 90% of his time in that state.  Feels safest when he is at home.   Noted feeling more relaxed after practicing the breathing exercise.  Expressed intentions of practicing the exercise once in the morning and once in the evening.      Indicated he is open to practicing the progressive muscle relaxation exercise as well.          Plan: Return in approximately one week.  Will continue with teaching techniques for relaxation.  Diagnosis: PTSD                         Mood Disorder Unspecified    Darrin LuisSolomon, Zanae Kuehnle A, LCSW 03/23/2016

## 2016-03-31 ENCOUNTER — Ambulatory Visit (HOSPITAL_COMMUNITY): Payer: Self-pay | Admitting: Psychiatry

## 2016-04-05 ENCOUNTER — Other Ambulatory Visit (HOSPITAL_COMMUNITY): Payer: Self-pay | Admitting: Psychiatry

## 2016-04-05 DIAGNOSIS — F321 Major depressive disorder, single episode, moderate: Secondary | ICD-10-CM

## 2016-04-05 DIAGNOSIS — F4310 Post-traumatic stress disorder, unspecified: Secondary | ICD-10-CM

## 2016-04-05 NOTE — Telephone Encounter (Signed)
[facility] a [facility] for Neurontin and Seroquel. Per Dr. Gilmore LarocheAkhtar, refill request is denied. Pt has request refill too early. Medications were filled on 03/03/16 with 1 additional refill. Pt is schedule for a f/u appt on 9/14.

## 2016-04-07 ENCOUNTER — Ambulatory Visit (HOSPITAL_COMMUNITY): Payer: Self-pay | Admitting: Licensed Clinical Social Worker

## 2016-04-08 ENCOUNTER — Encounter (HOSPITAL_COMMUNITY): Payer: Self-pay | Admitting: Psychiatry

## 2016-04-08 ENCOUNTER — Ambulatory Visit (INDEPENDENT_AMBULATORY_CARE_PROVIDER_SITE_OTHER): Payer: BLUE CROSS/BLUE SHIELD | Admitting: Psychiatry

## 2016-04-08 VITALS — BP 128/74 | HR 93 | Resp 18 | Ht 76.0 in | Wt 281.0 lb

## 2016-04-08 DIAGNOSIS — F0630 Mood disorder due to known physiological condition, unspecified: Secondary | ICD-10-CM

## 2016-04-08 DIAGNOSIS — F1921 Other psychoactive substance dependence, in remission: Secondary | ICD-10-CM | POA: Diagnosis not present

## 2016-04-08 DIAGNOSIS — F4310 Post-traumatic stress disorder, unspecified: Secondary | ICD-10-CM | POA: Diagnosis not present

## 2016-04-08 DIAGNOSIS — F321 Major depressive disorder, single episode, moderate: Secondary | ICD-10-CM | POA: Diagnosis not present

## 2016-04-08 MED ORDER — BUSPIRONE HCL 15 MG PO TABS: 15.0000 mg | ORAL_TABLET | Freq: Three times a day (TID) | ORAL | 1 refills | Status: DC

## 2016-04-08 MED ORDER — GABAPENTIN 600 MG PO TABS: 600.0000 mg | ORAL_TABLET | Freq: Two times a day (BID) | ORAL | 1 refills | Status: DC

## 2016-04-08 MED ORDER — QUETIAPINE FUMARATE 200 MG PO TABS: ORAL_TABLET | ORAL | 1 refills | Status: DC

## 2016-04-08 MED ORDER — PAROXETINE HCL 40 MG PO TABS: ORAL_TABLET | ORAL | 1 refills | Status: DC

## 2016-04-08 NOTE — Progress Notes (Signed)
Patient ID: Ricky Foster, male   DOB: 1991/03/11, 25 y.o.   MRN: [id number] [facility] Follow-up Outpatient Visit  Ricky Foster [id number] 25 y.o.  04/08/2016  Chief Complaint: Follow up for drug dependence and mood disorder.    History of Present Illness:   Patient returns for Medication Follow up and is diagnosed with OPiate dependence and unspecified mood disorder. Major depressive disorder. Anxiety  He was seen initially referred after he has been in a rehabilitation center for drug dependence mostly for opiates, Crystal and marijuana.   Last visit he he was doing reasonable so we continue the current medication he still stressed about the legal issue and some job stress After the visit apparently he has felt more agitated and then he stopped Wellbutrin tablets IS not taking Wellbutrin he feels other medication or on the right dosages including the Seroquel at a dose of 200 mg  He is liking his job. Legal issue doesn't bother him much and he is more positive.   Neurontin has helped his aches and  pain . Anxiety: gets overwhelmed under stress but nothing overwhelming as of now Mood disorder:not overwhelmed Drug dependence: remains sober 18 months. Handling the craving and no relapse. Still feels but less animosity towards the ex-girlfriend .  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 Has had one seizure since last visit. 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  Depression severity. 7/10. 10 being no depression. Somewhat better Timing: more so when by himself Duration; 6 years Context: jail stay and drug use in past.  There is no associated symptoms of mania including racing thoughts excessive indulgence in activity or elevated mood.  Marland KitchenHe is attending NA classes and remains optimistic and regular in his followups.   Past Medical History:  Diagnosis Date  . Allergic rhinitis   . Low back pain   . Seizure disorder (HCC)    Family History   Problem Relation Age of Onset  . Depression Maternal Aunt   . Alcohol abuse Maternal Uncle     Outpatient Encounter Prescriptions as of 04/08/2016  Medication Sig  . busPIRone (BUSPAR) 15 MG tablet Take 1 tablet (15 mg total) by mouth 3 (three) times daily.  . flunisolide (NASAREL) 29 MCG/ACT (0.025%) nasal spray Place 2 sprays into the nose.  . gabapentin (NEURONTIN) 600 MG tablet Take 1 tablet (600 mg total) by mouth 2 (two) times daily.  Marland Kitchen levETIRAcetam (KEPPRA) 500 MG tablet Take 500 mg by mouth.  . Loratadine 10 MG CAPS Take by mouth. Reported on 08/07/2015  . PARoxetine (PAXIL) 40 MG tablet Take one tablet daily (40mg  total).  . QUEtiapine (SEROQUEL) 200 MG tablet take 1 tab of 200mg  at night  . [DISCONTINUED] buPROPion (WELLBUTRIN) 100 MG tablet Take 1 tablet (100 mg total) by mouth daily.  . [DISCONTINUED] busPIRone (BUSPAR) 15 MG tablet Take 1 tablet (15 mg total) by mouth 3 (three) times daily.  . [DISCONTINUED] gabapentin (NEURONTIN) 600 MG tablet Take 1 tablet (600 mg total) by mouth 2 (two) times daily.  . [DISCONTINUED] PARoxetine (PAXIL) 40 MG tablet Take one tablet daily (40mg  total).  . [DISCONTINUED] QUEtiapine (SEROQUEL) 200 MG tablet take 1 tab of 200mg  at night   No facility-administered encounter medications on file as of 04/08/2016.     No results found for this or any previous visit (from the past 2160 hour(s)).  BP 128/74 (BP Location: Right Arm, Patient Position: Sitting, Cuff Size: Normal)   Pulse 93  Resp 18   Ht 6\' 4"  (1.93 m)   Wt 281 lb (127.5 kg)   SpO2 97%   BMI 34.20 kg/m    Review of Systems  Constitutional: Negative for fever.  Cardiovascular: Negative for palpitations.  Musculoskeletal: Negative for falls.  Skin: Negative for rash.  Neurological: Negative for tingling and tremors.  Psychiatric/Behavioral: Negative for substance abuse and suicidal ideas. The patient does not have insomnia.     Mental Status Examination  Appearance:  casual Alert: Yes Attention: fair  Cooperative: Yes Eye Contact: Fair Speech: normal tone Psychomotor Activity: Decreased Memory/Concentration: adequate Oriented: place, time/date and situation Mood: improved Affect: Congruent Thought Processes and Associations: Coherent Fund of Knowledge: Fair Thought Content: Suicidal ideation and Homicidal ideation were denied. No hallucinations Insight: Fair Judgement: Fair  Diagnosis: Polysubstance dependence opiate dependence. Unspecified episodic mood disorder. Major depressive disorder. Generalized anxiety disorder.   Treatment Plan:    Depression :impvored.  continue seroquel 200mg  qhs. Stop wellbutrin. Less edgy. continue paxil for anxiety and augmentation.  Continue  therapy for depression.  continue  gabapentin 600mg  bid. Refer to primary care if gaba needs to be increased for aches and pain Prescriptions printed.  Anxiety and stress related to court. Continue  buspar 15mg  to tid. It also helped his agitation in past.  He has remained sober. Advised to call sponsor daily and attend NA   No Involuntary movements.  Drug dependence: remains sober  CPap helps at night.   Advised to follow up with primary care if he continues high dose of neurontin.  Reviewed sleep hygiene.   . Discussed weight maintenance. He continues to go Gym  Follow up with Primary care provider in regards to Medical conditions.  Continue therapy with SArah. Greater than 50% of time was spend in counseling and coordination of care with the patient.  Schedule for Follow up visit in 2-3 months or call in earlier as necessary.  Time spent: 25 minutes [name], Ricky Starace, MD

## 2016-04-26 ENCOUNTER — Ambulatory Visit (INDEPENDENT_AMBULATORY_CARE_PROVIDER_SITE_OTHER): Payer: BLUE CROSS/BLUE SHIELD | Admitting: Licensed Clinical Social Worker

## 2016-04-26 DIAGNOSIS — F4310 Post-traumatic stress disorder, unspecified: Secondary | ICD-10-CM

## 2016-04-26 DIAGNOSIS — F0630 Mood disorder due to known physiological condition, unspecified: Secondary | ICD-10-CM | POA: Diagnosis not present

## 2016-04-26 NOTE — Progress Notes (Signed)
   THERAPIST PROGRESS NOTE  Session Time: 9:05am- 10:00am  Participation Level: Active  Behavioral Response: Casual Alert Euthymic    Type of Therapy: Individual therapy   Treatment Goals addressed: Elevate mood and show evidence of usual energy, activities, and socialization  Interventions: Relaxation training  Suicidal/Homicidal: Denied both  Therapist Interventions:  Gathered information about significant events and changes in mood and functioning since last seen approximately one month ago. Listened to a recording of a Armed forces logistics/support/administrative officerLoving Kindness Meditation.   Described an intervention patient can expect in the near future which involves having him think about a situation which led him to feel angry and practice using relaxation techniques in the moment to reduce anxiety.   Summary:  Reported feeling mostly satisfied with how he has been coping with distressing feelings.  Had some positive things happen in the past month: All 30 charges were dismissed and his record will be esponged  Looking forward to [place] able [place] in [place] Led some NA meetings and felt as though they went well Has started wrestling three days per week as a way to get exercise and relieve stress Has been practicing the different relaxation techniques as found them to be helpful  Reported he has decided to quit working at [employer] as soon as he obtains a new job.  Described how it has been difficult to work around the owner who has been calling him names which is triggering wanting to use.   Has two grandparents in poor health.  Planning to visit them this weekend.   Receptive to using the Foot LockerLoving Kindness meditation saying, "I'm glad you showed me this one.  It not only gets me to love myself but branch out and love others as well."       Plan: Will practice another relaxation exercise called Heartbeat Balance at next session.  Diagnosis: PTSD                         Mood Disorder  Unspecified    Darrin LuisSolomon, Sarah A, LCSW 04/26/2016

## 2016-05-04 ENCOUNTER — Ambulatory Visit (INDEPENDENT_AMBULATORY_CARE_PROVIDER_SITE_OTHER): Payer: BLUE CROSS/BLUE SHIELD | Admitting: Licensed Clinical Social Worker

## 2016-05-04 DIAGNOSIS — F4310 Post-traumatic stress disorder, unspecified: Secondary | ICD-10-CM

## 2016-05-04 NOTE — Progress Notes (Signed)
   THERAPIST PROGRESS NOTE  Session Time: 3:05pm-4:05pm   Participation Level: Active  Behavioral Response: Casual Alert Euthymic    Type of Therapy: Individual therapy   Treatment Goals addressed: Elevate mood and show evidence of usual energy, activities, and socialization  Interventions: Preparing for Exposure  Suicidal/Homicidal: Denied both  Therapist Interventions:  Discussed how patient can take advantage of support from his parents if he opens up to them about what is going on with him.  Also recommended letting them know what he is working on in therapy so that they can encourage him to use the coping strategies he is learning about.   Explained principles behind fighting fear by facing fear.  Described how you can gradually expose yourself to situations that trigger anxiety and use coping skills to manage the anxiety symptoms so that you no longer use avoidance as your main coping strategy.   Assigned homework to complete a Fears and Worries List which involves identifying situations that bring on anxiety and rating the anxiety level for those situations.    Summary:  Reported "My emotions are everywhere.  At times I've cried.  At other times I've been angry.  And other times I just feel like giving up."  Noted he has been isolating more and taking long naps.  Talked about how his mind races a lot, mostly dwelling on the past.  Acknowledged that he has been avoiding opening up to his parents about how he has been feeling because he doesn't want to worry and doesn't want to be a burden to them.  Also acknowledged that this has not been effective, and they worry regardless.  Expressed intentions of turning to them for support more often just like he turns to his NA sponsor for support.   Indicated he would like to work on being able to tolerate spending time in crowded environments.  Reported that at this time he is only able to tolerate it if he has a parent close by.  Agreed to  complete homework.         Plan: Will review homework and choose one of his fears with a lower anxiety rating to work on exposing him to.  Diagnosis: PTSD                         Mood Disorder Unspecified    Ricky Foster, Sarah A, LCSW 05/04/2016

## 2016-05-21 ENCOUNTER — Ambulatory Visit (HOSPITAL_COMMUNITY): Payer: Self-pay | Admitting: Licensed Clinical Social Worker

## 2016-05-24 DIAGNOSIS — S93432A Sprain of tibiofibular ligament of left ankle, initial encounter: Secondary | ICD-10-CM

## 2016-05-24 HISTORY — DX: Sprain of tibiofibular ligament of left ankle, initial encounter: S93.432A

## 2016-06-03 ENCOUNTER — Other Ambulatory Visit (HOSPITAL_COMMUNITY): Payer: Self-pay | Admitting: Psychiatry

## 2016-06-03 DIAGNOSIS — F321 Major depressive disorder, single episode, moderate: Secondary | ICD-10-CM

## 2016-06-03 DIAGNOSIS — F4310 Post-traumatic stress disorder, unspecified: Secondary | ICD-10-CM

## 2016-06-03 DIAGNOSIS — F0630 Mood disorder due to known physiological condition, unspecified: Secondary | ICD-10-CM

## 2016-06-04 NOTE — Telephone Encounter (Signed)
[facility] Buspar, gabapentin and Seroquel. Per Dr. Gilmore LarocheAkhtar, pt refills are authorized for Buspar 15mg , #90, Gabapentin 600mg , #60 and Seroquel 200mg , #30. Prescriptions were sent to pharmacy. Pt has a f/u appt on 12/5. Called and informed pt of prescription status. Pt verbalizes understanding.

## 2016-06-07 ENCOUNTER — Ambulatory Visit (INDEPENDENT_AMBULATORY_CARE_PROVIDER_SITE_OTHER): Payer: BLUE CROSS/BLUE SHIELD | Admitting: Licensed Clinical Social Worker

## 2016-06-07 DIAGNOSIS — F4310 Post-traumatic stress disorder, unspecified: Secondary | ICD-10-CM

## 2016-06-07 NOTE — Progress Notes (Signed)
   THERAPIST PROGRESS NOTE  Session Time: 11:05am-12:00pm   Participation Level: Active  Behavioral Response: Casual Alert Euthymic    Type of Therapy: Individual therapy   Treatment Goals addressed: Elevate mood and show evidence of usual energy, activities, and socialization  Interventions: Preparing for Exposure  Suicidal/Homicidal: Denied both  Therapist Interventions:   Gathered information about signficant events and changes in mood and functioning since last seen about a month ago. Revisited the idea of fighting fear by facing fear.  Explained the process he can expect to go through as the therapist supports him through telling the stories about his traumatic experiences.  Discussed how it will be important to have consistency with appointments during this phase of treatment.       Summary:  Reported that his mood had been pretty good up until last week when he started having more flashbacks, nightmares, insomnia, and irritability.  Not sure what to attribute the worsening of mood to, but did reflect on how he came in contact with old aquaintances who asked him how he has been and he opened up to them about his struggles with addiction.  Said he feels ready to enter the new phase of treatment.  Agrees that weekly appointments will be best.         Plan: Plan for next time will be to develop a memory hierarchy in preparation for narrative therapy.   Diagnosis: PTSD                         Mood Disorder Unspecified    Darrin LuisSolomon, Sarah A, LCSW 06/07/2016

## 2016-06-15 ENCOUNTER — Ambulatory Visit (HOSPITAL_COMMUNITY): Payer: Self-pay | Admitting: Licensed Clinical Social Worker

## 2016-06-29 ENCOUNTER — Ambulatory Visit (HOSPITAL_COMMUNITY): Payer: Self-pay | Admitting: Psychiatry

## 2016-07-01 ENCOUNTER — Ambulatory Visit (HOSPITAL_COMMUNITY): Payer: Self-pay | Admitting: Licensed Clinical Social Worker

## 2016-07-06 ENCOUNTER — Ambulatory Visit (HOSPITAL_COMMUNITY): Payer: Self-pay | Admitting: Psychiatry

## 2016-07-08 ENCOUNTER — Ambulatory Visit (HOSPITAL_COMMUNITY): Payer: Self-pay | Admitting: Licensed Clinical Social Worker

## 2016-07-09 ENCOUNTER — Ambulatory Visit (INDEPENDENT_AMBULATORY_CARE_PROVIDER_SITE_OTHER): Payer: BLUE CROSS/BLUE SHIELD | Admitting: Psychiatry

## 2016-07-09 ENCOUNTER — Encounter (HOSPITAL_COMMUNITY): Payer: Self-pay | Admitting: Psychiatry

## 2016-07-09 VITALS — BP 124/72 | HR 100 | Resp 16 | Ht 76.0 in

## 2016-07-09 DIAGNOSIS — F321 Major depressive disorder, single episode, moderate: Secondary | ICD-10-CM | POA: Diagnosis not present

## 2016-07-09 DIAGNOSIS — Z818 Family history of other mental and behavioral disorders: Secondary | ICD-10-CM

## 2016-07-09 DIAGNOSIS — F0630 Mood disorder due to known physiological condition, unspecified: Secondary | ICD-10-CM

## 2016-07-09 DIAGNOSIS — F4310 Post-traumatic stress disorder, unspecified: Secondary | ICD-10-CM

## 2016-07-09 DIAGNOSIS — F1921 Other psychoactive substance dependence, in remission: Secondary | ICD-10-CM | POA: Diagnosis not present

## 2016-07-09 DIAGNOSIS — Z79899 Other long term (current) drug therapy: Secondary | ICD-10-CM

## 2016-07-09 DIAGNOSIS — Z811 Family history of alcohol abuse and dependence: Secondary | ICD-10-CM

## 2016-07-09 MED ORDER — GABAPENTIN 600 MG PO TABS: 600.0000 mg | ORAL_TABLET | Freq: Two times a day (BID) | ORAL | 1 refills | Status: DC

## 2016-07-09 MED ORDER — BUSPIRONE HCL 15 MG PO TABS: 15.0000 mg | ORAL_TABLET | Freq: Three times a day (TID) | ORAL | 1 refills | Status: DC

## 2016-07-09 MED ORDER — PAROXETINE HCL 40 MG PO TABS: ORAL_TABLET | ORAL | 1 refills | Status: DC

## 2016-07-09 MED ORDER — QUETIAPINE FUMARATE 200 MG PO TABS: 200.0000 mg | ORAL_TABLET | Freq: Every evening | ORAL | 1 refills | Status: DC

## 2016-07-09 NOTE — Progress Notes (Signed)
Patient ID: Ricky DoctorBrandon Foster, male   DOB: October 17, 1990, 25 y.o.   MRN: [id number] [facility] Visit  Ricky DoctorBrandon Foster [id number] 25 y.o.  07/09/2016  Chief Complaint: Follow up for drug dependence and mood disorder.    History of Present Illness:   Patient returns for Medication Follow up and is diagnosed with OPiate dependence and unspecified mood disorder. Major depressive disorder. Anxiety  He was seen initially referred after he has been in a rehabilitation center for drug dependence mostly for opiates, Crystal and marijuana.   He was doing reasonable start is training for wrestling apparently injured his ankle series on the beach chair. He has had a surgery. Mood was doing reasonable otherwise he understands physical activity was limited so is feeling somewhat down and decreased energy  Oral mood symptoms are manageable to her medications including cervical sleeping better Neurontin also helps mood any craving Drug use: remains sober   Continues to take keppra for seizures. Has had one seizure since last visit. 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  Depression severity. 6/10 .     Past Medical History:  Diagnosis Date  . Allergic rhinitis   . Low back pain   . Seizure disorder (HCC)    Family History  Problem Relation Age of Onset  . Depression Maternal Aunt   . Alcohol abuse Maternal Uncle     Outpatient Encounter Prescriptions as of 07/09/2016  Medication Sig  . busPIRone (BUSPAR) 15 MG tablet Take 1 tablet (15 mg total) by mouth 3 (three) times daily.  . flunisolide (NASAREL) 29 MCG/ACT (0.025%) nasal spray Place 2 sprays into the nose.  . gabapentin (NEURONTIN) 600 MG tablet Take 1 tablet (600 mg total) by mouth 2 (two) times daily.  Marland Kitchen. HYDROcodone-acetaminophen (NORCO/VICODIN) 5-325 MG tablet Take 1-2 tablets every 4-6 hours as needed for pain.  Do not take additional Tylenol (acetaminophen) with this medication.  . levETIRAcetam  (KEPPRA) 500 MG tablet Take 500 mg by mouth.  . Loratadine 10 MG CAPS Take by mouth. Reported on 08/07/2015  . PARoxetine (PAXIL) 40 MG tablet Take one tablet daily (40mg  total).  . QUEtiapine (SEROQUEL) 200 MG tablet Take 1 tablet (200 mg total) by mouth every evening.  . [DISCONTINUED] busPIRone (BUSPAR) 15 MG tablet take 1 tablet by mouth three times a day  . [DISCONTINUED] gabapentin (NEURONTIN) 600 MG tablet take 1 tablet by mouth twice a day  . [DISCONTINUED] PARoxetine (PAXIL) 40 MG tablet Take one tablet daily (40mg  total).  . [DISCONTINUED] QUEtiapine (SEROQUEL) 200 MG tablet take 1 tablet by mouth every evening   No facility-administered encounter medications on file as of 07/09/2016.     No results found for this or any previous visit (from the past 2160 hour(s)).  BP 124/72 (BP Location: Right Arm, Patient Position: Sitting, Cuff Size: Normal)   Pulse 100   Resp 16   Ht 6\' 4"  (1.93 m)   SpO2 96%    Review of Systems  Constitutional: Negative for fever.  Cardiovascular: Negative for chest pain.  Gastrointestinal: Negative for nausea.  Musculoskeletal: Positive for joint pain. Negative for falls.  Skin: Negative for rash.  Neurological: Negative for tingling and tremors.  Psychiatric/Behavioral: Negative for substance abuse and suicidal ideas. The patient does not have insomnia.     Mental Status Examination  Appearance: casual Alert: Yes Attention: fair  Cooperative: Yes Eye Contact: Fair Speech: normal tone Psychomotor Activity: Decreased Memory/Concentration: adequate Oriented: place, time/date and situation Mood: fair Affect: Congruent Thought Processes  and Associations: Coherent Fund of Knowledge: Fair Thought Content: Suicidal ideation and Homicidal ideation were denied. No hallucinations Insight: Fair Judgement: Fair  Diagnosis: Polysubstance dependence opiate dependence. Unspecified episodic mood disorder. Major depressive disorder. Generalized  anxiety disorder.   Treatment Plan:   1. Depression: not worsened. Continue seroquel 2. Mood disorder; continue gabapentin and paxil 3. Anxiety/ stress; continue paxil. Mild  He remains overall drugs sober but  is injured his leg on cast.  wellbutrin made him edgy in past.  Prescriptions printed.   He has remained sober. Advised to call sponsor daily and attend NA   No Involuntary movements.  Drug dependence: remains sober  FU in 2 months or earlier if needed. Say may come back in 3 or call earlier   [name], Jabarie Pop, MD

## 2016-07-15 ENCOUNTER — Ambulatory Visit (INDEPENDENT_AMBULATORY_CARE_PROVIDER_SITE_OTHER): Payer: BLUE CROSS/BLUE SHIELD | Admitting: Licensed Clinical Social Worker

## 2016-07-15 DIAGNOSIS — F0630 Mood disorder due to known physiological condition, unspecified: Secondary | ICD-10-CM

## 2016-07-15 DIAGNOSIS — F4310 Post-traumatic stress disorder, unspecified: Secondary | ICD-10-CM

## 2016-07-15 NOTE — Progress Notes (Signed)
   THERAPIST PROGRESS NOTE  Session Time: 10:10am-11:05am   Participation Level: Active  Behavioral Response: Sat in a wheelchair throughout the session Disheveled Alert Depressed   Type of Therapy: Individual therapy   Treatment Goals addressed: Elevate mood and show evidence of usual energy, activities, and socialization  Interventions: CBT  Suicidal/Homicidal: Denied both  Therapist Interventions:  Gathered information about significant events and changes in mood and functioning since last seen over a month ago. Reminded patient that it is not unusual for individuals with PTSD to have trouble remembering details about the traumatic events they experienced.  Encouraged him to adopt an attitude of acceptance regarding his inability to remember those things.          Summary:  Had reconstructive ankle surgery 2 weeks ago.  Admitted he is often in a lot of pain.  Is on some pain medication but he is being very careful about only taking it once a day.  Reported his depression has definitely worsened since the surgery.  For a while he wasn't eating.  Talked about how it has been hard to cope with the fact that his life basically got put on hold and he is very limited in activities he can do.  Indicated that he has been dwelling a lot of his past and labeling himself as being "useless."     Expressed appreciation for therapist helping him to change his perspective.  Said, "I need to stop beating myself up for not remembering every detail of my past.  Maybe there is a reason why I don't remember."    Plan: May move forward with plan to develop a memory hierarchy in preparation for narrative therapy.   Diagnosis: PTSD                         Mood Disorder     Darrin LuisSolomon, Sarah A, LCSW 07/15/2016

## 2016-07-22 ENCOUNTER — Ambulatory Visit (HOSPITAL_COMMUNITY): Payer: Self-pay | Admitting: Licensed Clinical Social Worker

## 2016-07-28 ENCOUNTER — Ambulatory Visit (INDEPENDENT_AMBULATORY_CARE_PROVIDER_SITE_OTHER): Payer: BLUE CROSS/BLUE SHIELD | Admitting: Licensed Clinical Social Worker

## 2016-07-28 DIAGNOSIS — F1921 Other psychoactive substance dependence, in remission: Secondary | ICD-10-CM | POA: Diagnosis not present

## 2016-07-28 DIAGNOSIS — F0630 Mood disorder due to known physiological condition, unspecified: Secondary | ICD-10-CM

## 2016-07-28 DIAGNOSIS — F4310 Post-traumatic stress disorder, unspecified: Secondary | ICD-10-CM

## 2016-07-28 NOTE — Progress Notes (Signed)
   THERAPIST PROGRESS NOTE  Session Time: 1:05pm-2:00pm   Participation Level: Active   Behavioral Response: Casual Alert Anxious Used crutches to assist him with walking  Type of Therapy: Individual therapy   Treatment Goals addressed: Elevate mood and show evidence of usual energy, activities, and socialization  Interventions: Review of psycho-ed about trauma, interpersonal effectiveness  Suicidal/Homicidal: Denied both  Therapist Interventions:   Reminded patient that it is normal to "feel crazy" after you have experienced traumatic events.  Also discussed how it is normal to have certain triggers based on past experiences which automatically turn on your fight or flight response.   Talked about how patient sometimes feels guilty when he hesitates to share information about his past with others.  Emphasized that not everyone needs to know the details of his past and it makes sense to withhold information until you feel like you can trust that other person.   Reminded patient of next step in trauma treatment to create a memory hierarchy.      Summary:  Talked about how it is difficult sometimes to interact with friends especially those that he is reconnecting with that he knew before having problems with addiction.  He feels like he owes them an explanation for his behavior during his struggles with addiction.  Later in the session he acknowledged he does not have to aim to fit into what society deems as "normal" and he needs to accept himself for who he is as a person who has experienced many traumatic events in his life.  He also concluded "It's ok if I'm not ready to share details about what I've been though.  I have the right to choose what information to share."   When reminded of what is involved in the process of developing a trauma memory hierarchy he said, "I wasn't so confident last time we met, but after today's session I do feel like I'm ready for that next step."  Plan:  Scheduled to return next week.  Will start developing his trauma memory hierarchy.   Diagnosis: PTSD                         Mood Disorder     Armandina Stammer 07/28/2016

## 2016-08-06 ENCOUNTER — Ambulatory Visit (INDEPENDENT_AMBULATORY_CARE_PROVIDER_SITE_OTHER): Payer: BLUE CROSS/BLUE SHIELD | Admitting: Licensed Clinical Social Worker

## 2016-08-06 DIAGNOSIS — F4310 Post-traumatic stress disorder, unspecified: Secondary | ICD-10-CM | POA: Diagnosis not present

## 2016-08-06 DIAGNOSIS — F0630 Mood disorder due to known physiological condition, unspecified: Secondary | ICD-10-CM
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
## 2016-08-09 ENCOUNTER — Ambulatory Visit (INDEPENDENT_AMBULATORY_CARE_PROVIDER_SITE_OTHER): Payer: BLUE CROSS/BLUE SHIELD | Admitting: Licensed Clinical Social Worker

## 2016-08-09 DIAGNOSIS — F1921 Other psychoactive substance dependence, in remission: Secondary | ICD-10-CM | POA: Diagnosis not present

## 2016-08-09 DIAGNOSIS — F4310 Post-traumatic stress disorder, unspecified: Secondary | ICD-10-CM

## 2016-08-09 NOTE — Progress Notes (Signed)
   THERAPIST PROGRESS NOTE  Session Time: 2:10pm-3:05pm   Participation Level: Active   Behavioral Response: Casual Alert Euthymic Used crutches to assist him with walking  Type of Therapy: Individual therapy   Treatment Goals addressed:  Original treatment goal: Elevate mood and show evidence of usual energy, activities, and socialization New treatment goal: Experience a reduction in PTSD-related symptoms  Interventions: Treatment plan review and update  Suicidal/Homicidal: Denied both  Therapist Interventions:   Reviewed patient's progress with treatment goals related to reducing depressive symptoms.  Determined that he has made some significant progress in all areas including increased socialization, reducing anxiety by practicing breathing exercises and mindfulness, communicating more openly with loved ones, and engaging in activities.   Had patient complete a PCL-5 to assess for presence and severity of PTSD-related symptoms.  Compared his responses with those he had the last time he completed the assessment tool in August.  Suggested using the tool as a means to measure progress with reducing PTSD symptoms over time.       Summary:  Concluded that despite the setback of his injury he has made a lot of progress with managing his depression.  Reflected on how a year ago he was isolating a lot, trying to "sleep his days away," and it was easy for him to get caught up in his depressive thinking.   His score on the PCL-5 today was 57.  In August his score was 63.  Rated the majority of items as bothering him quite a bit or extremely in the past month.  Indicated that he liked the idea of using the PCL-5 as a way to measure his progress.       Plan: Scheduled to return next week. Will conduct the first trauma narrative.    Diagnosis: PTSD                             Darrin LuisSolomon, Berda Shelvin A, LCSW 08/09/2016

## 2016-08-20 ENCOUNTER — Ambulatory Visit (INDEPENDENT_AMBULATORY_CARE_PROVIDER_SITE_OTHER): Payer: BLUE CROSS/BLUE SHIELD | Admitting: Licensed Clinical Social Worker

## 2016-08-20 DIAGNOSIS — F4310 Post-traumatic stress disorder, unspecified: Secondary | ICD-10-CM

## 2016-08-20 NOTE — Progress Notes (Signed)
   THERAPIST PROGRESS NOTE  Session Time: 10:00am-11:00am   Participation Level: Active   Behavioral Response: Casual Alert Sad  Type of Therapy: Individual therapy   Treatment Goals addressed: Experience a reduction in PTSD-related symptoms  Interventions: Mindfulness  Suicidal/Homicidal: Denied both  Therapist Interventions:   Learned about a tragic death which occurred within patient's family.  Validated the variety of feelings he has been experiencing considering there are so many unanswered questions about what happened.  Encouraged patient to consider what challenging feelings he may experience within the next week.  Discussed how he can practice mindfulness to cope with distressing thoughts and feelings.          Summary:   Reported that a week ago he learned that his cousin died.  The cause of death is not clear.  There is an investigation going on.   Mentioned he is planning to visit family in [place] this weekend.  Aims to focus on bonding with his grandparents.   Indicated that while he has been experiencing a lot of distressing feelings he has maintained an accepting attitude about having them. Patient no longer needs to use crutches.  He noted that he has been feeling less restricted and that has been nice.    Plan: Scheduled to return next week. Will conduct the first trauma narrative.    Diagnosis: PTSD                             Darrin LuisSolomon, Rodolfo Gaster A, LCSW 08/20/2016

## 2016-08-26 ENCOUNTER — Ambulatory Visit (INDEPENDENT_AMBULATORY_CARE_PROVIDER_SITE_OTHER): Payer: BLUE CROSS/BLUE SHIELD | Admitting: Licensed Clinical Social Worker

## 2016-08-26 DIAGNOSIS — F1921 Other psychoactive substance dependence, in remission: Secondary | ICD-10-CM | POA: Diagnosis not present

## 2016-08-26 DIAGNOSIS — F4310 Post-traumatic stress disorder, unspecified: Secondary | ICD-10-CM | POA: Diagnosis not present

## 2016-08-26 NOTE — Progress Notes (Signed)
   THERAPIST PROGRESS NOTE  Session Time: 2:00pm-3:00pm   Participation Level: Active   Behavioral Response: Casual Alert Frustrated, but hopeful  Type of Therapy: Individual therapy   Treatment Goals addressed: Experience a reduction in PTSD-related symptoms  Interventions: Review of patient's implementation of coping skills  Suicidal/Homicidal: Denied both  Therapist Interventions:   Learned about how patient has experienced a significant increase in anxiety and anger due to discovering that his grandmother is not being well taken care of by his aunt.  Provided positive feedback regarding how he has been coping with these distressing feelings.    Summary:   Reflected on how he has come a long way when it comes to being able to identify what he is feeling and the need for him to choose a healthy way to cope with any distressing feelings.  Noted that he has resisted urges to tell his aunt exactly how he feels about her behavior.  Talked about how he realizes that while he wants to give his input ultimately it is up to his mom and her brothers to do something about the situation.   Planning to come together as a family to express concern to patient's grandmother about the quality of her care.      Plan: Scheduled to return next week. Will conduct the first trauma narrative as long as there haven't been any major events patient needs to process.    Diagnosis: PTSD                             Darrin LuisSolomon, Leobardo Granlund A, LCSW 08/26/2016

## 2016-09-03 ENCOUNTER — Ambulatory Visit (HOSPITAL_COMMUNITY): Payer: Self-pay | Admitting: Licensed Clinical Social Worker

## 2016-09-10 ENCOUNTER — Ambulatory Visit (HOSPITAL_COMMUNITY): Payer: Self-pay | Admitting: Licensed Clinical Social Worker

## 2016-09-24 ENCOUNTER — Ambulatory Visit (INDEPENDENT_AMBULATORY_CARE_PROVIDER_SITE_OTHER): Payer: BLUE CROSS/BLUE SHIELD | Admitting: Licensed Clinical Social Worker

## 2016-09-24 DIAGNOSIS — F4310 Post-traumatic stress disorder, unspecified: Secondary | ICD-10-CM | POA: Diagnosis not present

## 2016-09-27 ENCOUNTER — Ambulatory Visit (HOSPITAL_COMMUNITY): Payer: Self-pay | Admitting: Psychiatry
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
## 2016-09-28 ENCOUNTER — Ambulatory Visit (HOSPITAL_COMMUNITY): Payer: Self-pay | Admitting: Licensed Clinical Social Worker

## 2016-11-08 ENCOUNTER — Ambulatory Visit (HOSPITAL_COMMUNITY): Payer: Self-pay | Admitting: Licensed Clinical Social Worker

## 2016-11-09 ENCOUNTER — Ambulatory Visit (INDEPENDENT_AMBULATORY_CARE_PROVIDER_SITE_OTHER): Payer: BLUE CROSS/BLUE SHIELD | Admitting: Licensed Clinical Social Worker

## 2016-11-09 DIAGNOSIS — F4310 Post-traumatic stress disorder, unspecified: Secondary | ICD-10-CM

## 2016-11-09 NOTE — Progress Notes (Signed)
   THERAPIST PROGRESS NOTE  Session Time: 4:05pm-5:00pm   Participation Level: Active   Behavioral Response: Casual Alert A bit sad  Type of Therapy: Individual therapy   Treatment Goals addressed: Experience a reduction in PTSD-related symptoms  Interventions: Assessment, Review of Coping Skills  Suicidal/Homicidal: Denied both  Therapist Interventions:   Gathered information about significant events and changes in mood and functioning since last seen in early March.   Asked patient if he listened to the first trauma recording and what that experience was like for him.    Summary:  Started his new job.  [employer] that he likes it and actually looks forward to going into work.  He stays busy throughout the workday and that is helpful.   Concerned about grandmother's health.  Doctors have said they have done all they can.  Wants to spend as much time as he can with her.   Reported he lost another 3 friends to overdose.  Noted this has probably been a big factor in a worsening of his depression.  Hasn't been sleeping or eating well, admits to isolating more often, having crying spells, and times when his anger is elevated.   Said he listened to the trauma recording daily for about 2 weeks.  Noted that over time his distress level did go down to a managable level.      Plan: Scheduled to return next week.  Will return to narrative storytelling.     Diagnosis: PTSD                             Darrin Luis 09/24/2016

## 2016-11-15 ENCOUNTER — Ambulatory Visit (INDEPENDENT_AMBULATORY_CARE_PROVIDER_SITE_OTHER): Payer: BLUE CROSS/BLUE SHIELD | Admitting: Licensed Clinical Social Worker

## 2016-11-15 DIAGNOSIS — F4310 Post-traumatic stress disorder, unspecified: Secondary | ICD-10-CM | POA: Diagnosis not present
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
## 2016-12-03 ENCOUNTER — Ambulatory Visit (HOSPITAL_COMMUNITY): Payer: Self-pay | Admitting: Licensed Clinical Social Worker

## 2016-12-07 ENCOUNTER — Ambulatory Visit (HOSPITAL_COMMUNITY): Payer: Self-pay | Admitting: Licensed Clinical Social Worker

## 2016-12-31 ENCOUNTER — Ambulatory Visit (INDEPENDENT_AMBULATORY_CARE_PROVIDER_SITE_OTHER): Payer: BLUE CROSS/BLUE SHIELD | Admitting: Licensed Clinical Social Worker

## 2016-12-31 DIAGNOSIS — F331 Major depressive disorder, recurrent, moderate: Secondary | ICD-10-CM | POA: Diagnosis not present

## 2016-12-31 DIAGNOSIS — F4310 Post-traumatic stress disorder, unspecified: Secondary | ICD-10-CM | POA: Diagnosis not present
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
## 2017-01-18 ENCOUNTER — Ambulatory Visit (INDEPENDENT_AMBULATORY_CARE_PROVIDER_SITE_OTHER): Payer: BLUE CROSS/BLUE SHIELD | Admitting: Licensed Clinical Social Worker

## 2017-01-18 DIAGNOSIS — F4310 Post-traumatic stress disorder, unspecified: Secondary | ICD-10-CM | POA: Diagnosis not present

## 2017-01-18 DIAGNOSIS — F331 Major depressive disorder, recurrent, moderate: Secondary | ICD-10-CM | POA: Diagnosis not present
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
## 2017-01-27 ENCOUNTER — Ambulatory Visit (INDEPENDENT_AMBULATORY_CARE_PROVIDER_SITE_OTHER): Payer: BLUE CROSS/BLUE SHIELD | Admitting: Licensed Clinical Social Worker

## 2017-01-27 DIAGNOSIS — F4310 Post-traumatic stress disorder, unspecified: Secondary | ICD-10-CM | POA: Diagnosis not present

## 2017-01-28 NOTE — Progress Notes (Signed)
   THERAPIST PROGRESS NOTE  Session Time: 4:09pm-5:01pm   Participation Level: Active   Behavioral Response: Casual Alert Mostly euthymic  Type of Therapy: Individual therapy   Treatment Goals addressed: Experience a reduction in PTSD-related symptoms  Interventions:  Treatment plan review  Suicidal/Homicidal: Denied both  Therapist Interventions:  Updated patient's treatment plan.  Had him complete a PCL-5 to assess for changes in presence and severity of PTSD-related symptoms.  Compared results to previous scores on the assessment tool and provided positive feedback regarding his progress in treatment.  Collaboratively decided to continue to work on a goal of reducing PTSD-related symptoms.     Summary:  Determined that patient achieved his treatment goal of reducing his score on the PCL-5 from 57 to 45 or less.  His score today was 44.  Some of the items he rated as bothering him considerably less than before were: Having strong negative beliefs about himself, others, or the world  Having strong negative feelings (fear, anger, guilt, shame) Loss of interest in activities Trouble experiencing positive emotions Difficulty concentrating  Expressed how he would like to continue to work on not being bothered so much by reminders of traumatic events and reducing blame for himself and others.  With some reflection he was able to acknowledge progress he has made especially in terms of his efforts to participate as a productive member of society.   Described how he managed to cope with being in a crowded environment and being triggered while watching the fireworks on the 4th of July.  Indicated he was proud of himself for making spending time with his family a priority rather than let his fear drive him towards avoidance.    New treatment goal is: Ricky Foster will continue to experience a significant reduction in PTSD-related symptoms as evidenced by a reduced score on the PCL-5 from 44 to 35  or less.  He will continue to strive to reach personal goals, participate in activities, and get enjoyment and a sense of satisfaction from those activities.   Plan: Scheduled to return in one week.  Will return to narrative storytelling of traumatic events.  Diagnosis: PTSD                                                      Darrin LuisSolomon, Sarah A, LCSW 01/27/2017

## 2017-02-03 ENCOUNTER — Ambulatory Visit (INDEPENDENT_AMBULATORY_CARE_PROVIDER_SITE_OTHER): Payer: BLUE CROSS/BLUE SHIELD | Admitting: Licensed Clinical Social Worker

## 2017-02-03 DIAGNOSIS — F4310 Post-traumatic stress disorder, unspecified: Secondary | ICD-10-CM
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
## 2017-02-09 ENCOUNTER — Ambulatory Visit (HOSPITAL_COMMUNITY): Payer: Self-pay | Admitting: Licensed Clinical Social Worker

## 2017-02-16 ENCOUNTER — Ambulatory Visit (HOSPITAL_COMMUNITY): Payer: Self-pay | Admitting: Licensed Clinical Social Worker

## 2017-02-23 ENCOUNTER — Ambulatory Visit (INDEPENDENT_AMBULATORY_CARE_PROVIDER_SITE_OTHER): Payer: BLUE CROSS/BLUE SHIELD | Admitting: Licensed Clinical Social Worker

## 2017-02-23 DIAGNOSIS — F4310 Post-traumatic stress disorder, unspecified: Secondary | ICD-10-CM

## 2017-02-23 NOTE — Progress Notes (Signed)
   THERAPIST PROGRESS NOTE  Session Time: 10:06am-10:59am   Participation Level: Active   Behavioral Response: Casual Alert Frustrated  Type of Therapy: Individual therapy   Treatment Goals addressed: Experience a reduction in PTSD-related symptoms  Interventions:  Processing traumatic memories  Suicidal/Homicidal: Denied both  Therapist Interventions:   Discussed patient's thoughts and feelings upon learning that his cousin has been smoking weed as a way to avoid feeling painful feelings associated with the death of his brother. Got an update on patient's experience of listening to the recording of the narrative about his mom's car accident.  Explored the thoughts he has been having as he listens.  Pointed out that he has been telling himself he shouldn't feel the way that he has been feeling and this only adds more frustration.  Encouraged him to adopt an attitude of acceptance about his feelings of anger instead.      Summary:  Reported that he had a conversation with his cousin about his drug use.  Encouraged his cousin to find a different way to cope with his painful feelings.  Pointed out to him that once the high wears off the painful feelings always return, so it is not really a solution.   Reported that his SUDS ratings did not decrease beyond a 60.  Struggled to understand why he can't seem to let go of his anger.  Agreed that he has been getting too caught up in judging himself for his feelings.  Plans to work on acceptance of his anger.   Developed an idea to write down his thoughts that he has as he listens to the recording so that he can bring those in for discussion and work on changing those thoughts to be less judgmental.      Plan:  Will continue with processing traumatic memories  Diagnosis: PTSD                                                      Darrin LuisSolomon, Sarah A, LCSW 02/23/2017

## 2017-05-18 ENCOUNTER — Other Ambulatory Visit (HOSPITAL_COMMUNITY): Payer: Self-pay | Admitting: Psychiatry

## 2018-10-05 ENCOUNTER — Other Ambulatory Visit: Payer: Self-pay

## 2018-10-05 ENCOUNTER — Ambulatory Visit (INDEPENDENT_AMBULATORY_CARE_PROVIDER_SITE_OTHER): Payer: No Typology Code available for payment source

## 2018-10-05 ENCOUNTER — Other Ambulatory Visit: Payer: Self-pay | Admitting: Physician Assistant

## 2018-10-05 ENCOUNTER — Encounter: Payer: Self-pay | Admitting: Physician Assistant

## 2018-10-05 ENCOUNTER — Ambulatory Visit (INDEPENDENT_AMBULATORY_CARE_PROVIDER_SITE_OTHER): Payer: No Typology Code available for payment source | Admitting: Physician Assistant

## 2018-10-05 VITALS — BP 118/79 | HR 82 | Ht 76.0 in | Wt 250.0 lb

## 2018-10-05 DIAGNOSIS — F17200 Nicotine dependence, unspecified, uncomplicated: Secondary | ICD-10-CM | POA: Insufficient documentation

## 2018-10-05 DIAGNOSIS — R918 Other nonspecific abnormal finding of lung field: Secondary | ICD-10-CM | POA: Insufficient documentation

## 2018-10-05 DIAGNOSIS — S93432S Sprain of tibiofibular ligament of left ankle, sequela: Secondary | ICD-10-CM

## 2018-10-05 DIAGNOSIS — M25572 Pain in left ankle and joints of left foot: Secondary | ICD-10-CM | POA: Diagnosis not present

## 2018-10-05 DIAGNOSIS — F515 Nightmare disorder: Secondary | ICD-10-CM

## 2018-10-05 DIAGNOSIS — F4310 Post-traumatic stress disorder, unspecified: Secondary | ICD-10-CM

## 2018-10-05 DIAGNOSIS — Z9889 Other specified postprocedural states: Secondary | ICD-10-CM

## 2018-10-05 DIAGNOSIS — Z1329 Encounter for screening for other suspected endocrine disorder: Secondary | ICD-10-CM

## 2018-10-05 DIAGNOSIS — G8929 Other chronic pain: Secondary | ICD-10-CM

## 2018-10-05 DIAGNOSIS — G629 Polyneuropathy, unspecified: Secondary | ICD-10-CM

## 2018-10-05 DIAGNOSIS — Z131 Encounter for screening for diabetes mellitus: Secondary | ICD-10-CM

## 2018-10-05 DIAGNOSIS — F0630 Mood disorder due to known physiological condition, unspecified: Secondary | ICD-10-CM

## 2018-10-05 DIAGNOSIS — Z1159 Encounter for screening for other viral diseases: Secondary | ICD-10-CM

## 2018-10-05 DIAGNOSIS — E041 Nontoxic single thyroid nodule: Secondary | ICD-10-CM | POA: Insufficient documentation

## 2018-10-05 DIAGNOSIS — Z7689 Persons encountering health services in other specified circumstances: Secondary | ICD-10-CM

## 2018-10-05 MED ORDER — TRAZODONE HCL 100 MG PO TABS: 100.0000 mg | ORAL_TABLET | Freq: Every day | ORAL | 1 refills | Status: DC

## 2018-10-05 MED ORDER — CELECOXIB 100 MG PO CAPS: 100.0000 mg | ORAL_CAPSULE | Freq: Two times a day (BID) | ORAL | 1 refills | Status: AC | PRN

## 2018-10-05 MED ORDER — PRAZOSIN HCL 1 MG PO CAPS: 1.0000 mg | ORAL_CAPSULE | Freq: Every day | ORAL | 1 refills | Status: DC

## 2018-10-05 MED ORDER — BUSPIRONE HCL 30 MG PO TABS: 15.0000 mg | ORAL_TABLET | Freq: Three times a day (TID) | ORAL | 1 refills | Status: DC

## 2018-10-05 MED ORDER — DULOXETINE HCL 40 MG PO CPEP: 1.0000 | ORAL_CAPSULE | Freq: Two times a day (BID) | ORAL | 0 refills | Status: DC

## 2018-10-05 NOTE — Progress Notes (Signed)
HPI:                                                                Ricky Foster is a 28 y.o. male who presents to [facility] today to establish care  Current concerns:  PTSD/Mood disorder - currently taking Buspar 15 mg three times daily but feels like he has become tolerant to the medication after several years. He also takes Trazodone 100 mg at bedtime, but states this is no longer effective for him either.  Reports sleeping is horrible. He is having a lot of nightmares described as "horrific dreams," waking up crying out and sweating.  Finds himself feeling more anxious for no apparent reason.  He has a history of polysubstance abuse (amphetamines, opioids, alcohol) and has been sober for 5 years. He used to follow with Psychiatry (Dr. De Nurse) and was on Seroquel 200 mg for a mood disorder at one time He also used to see a counselor, which he states was helpful, but he had to stop going due to conflicts with his work schedule   Chronic pain - reports "stinging and numbing" pain of his bilateral legs from the thighs down for the last 4-5 years. Currently takes Cymbalta 40 mg bid for this. In the past he took Gabapentin, but it became ineffective. Admits to history of heavy alcohol consumption for several years. Has been sober for the last 5 years.  Left foot/ankle pain and swelling - hx of ORIF and left ankle arthroscopy for closed fracture of posterior malleolus in 06/2016. States he had a prolonged recovery and was in a CAM boot walker for 6 months. States he had only 4 sessions of PT and was discharged. He has had chronic left lateral foot and ankle pain/swelling since that time. It is worse with prolonged standing and walking. Takes Celebrex 100 mg twice daily as needed.  Depression screen PHQ 2/9 10/05/2018  Decreased Interest 1  Down, Depressed, Hopeless 2  PHQ - 2 Score 3  Altered sleeping 3  Tired, decreased energy 3  Change  in appetite 1  Feeling bad or failure about yourself  2  Trouble concentrating 0  Moving slowly or fidgety/restless 0  Suicidal thoughts 0  PHQ-9 Score 12  Difficult doing work/chores Somewhat difficult  Some encounter information is confidential and restricted. Go to Review Flowsheets activity to see all data.    GAD 7 : Generalized Anxiety Score 10/05/2018  Nervous, Anxious, on Edge 1  Control/stop worrying 2  Worry too much - different things 3  Trouble relaxing 3  Restless 2  Easily annoyed or irritable 2  Afraid - awful might happen 1  Total GAD 7 Score 14  Anxiety Difficulty Somewhat difficult      Past Medical History:  Diagnosis Date  . Allergic rhinitis   . Anxiety   . Depression   . Low back pain   . Seizure disorder ([place])   . Syndesmotic disruption of left ankle 05/24/2016   Overview:  Sports Medicine - Dr Emogene Morgan Daws   Past Surgical History:  Procedure Laterality Date  . FRACTURE SURGERY Left    ankle   Social History   Tobacco Use  . Smoking status: Current Every Day Smoker  Packs/day: 0.50    Years: 4.00    Pack years: 2.00    Types: Cigarettes  . Smokeless tobacco: Current User    Types: Chew  Substance Use Topics  . Alcohol use: No    Alcohol/week: 0.0 standard drinks    Comment: sober since 2015   family history includes Alcohol abuse in his brother and maternal uncle; Autoimmune disease in his mother; Bone cancer in his maternal grandfather; Breast cancer in his maternal aunt; Depression in his maternal aunt; Diabetes in an other family member; Fibromyalgia in his mother; Pancreatic cancer in his paternal grandfather.    Review of Systems  Constitutional: Positive for fatigue.  Musculoskeletal: Positive for arthralgias (left foot/ankle) and myalgias (bilateral legs).  Psychiatric/Behavioral: Positive for agitation, decreased concentration and dysphoric mood. Negative for self-injury and suicidal ideas. The patient is nervous/anxious.    All other systems reviewed and are negative.    Medications: Current Outpatient Medications  Medication Sig Dispense Refill  . busPIRone (BUSPAR) 30 MG tablet Take 0.5 tablets (15 mg total) by mouth 3 (three) times daily. 90 tablet 1  . celecoxib (CELEBREX) 100 MG capsule Take 1 capsule (100 mg total) by mouth 2 (two) times daily as needed for moderate pain. 60 capsule 1  . DULoxetine HCl 40 MG CPEP Take 1 capsule by mouth 2 (two) times daily. 180 capsule 0  . traZODone (DESYREL) 100 MG tablet Take 100 mg by mouth at bedtime.    . prazosin (MINIPRESS) 1 MG capsule Take 1 capsule (1 mg total) by mouth at bedtime. 30 capsule 1   No current facility-administered medications for this visit.    No Known Allergies     Objective:  BP 118/79   Pulse 82   Ht _0  (1.93 m)   Wt 250 lb (113.4 kg)   BMI 30.43 kg/m  Gen:  alert, not ill-appearing, no distress, appropriate for age 52: head normocephalic without obvious abnormality, conjunctiva and cornea clear, trachea midline Pulm: Normal work of breathing, normal phonation, clear to auscultation bilaterally, no wheezes, rales or rhonchi CV: Normal rate, regular rhythm, s1 and s2 distinct, no murmurs, clicks or rubs  Neuro: alert and oriented x 3, no tremor MSK: extremities atraumatic, normal gait and station Skin: intact, no rashes on exposed skin, no jaundice, no cyanosis Psych: well-groomed, cooperative, good eye contact, euthymic mood, affect mood-congruent, speech is articulate, and thought processes clear and goal-directed    No results found for this or any previous visit (from the past 72 hour(s)). No results found.    Assessment and Plan: 28 y.o. male with   .Ricky Foster was seen today for establish care.  Diagnoses and all orders for this visit:  Encounter to establish care  Nightmare disorder -     prazosin (MINIPRESS) 1 MG capsule; Take 1 capsule (1 mg total) by mouth at bedtime.  Mood disorder in conditions  classified elsewhere -     busPIRone (BUSPAR) 30 MG tablet; Take 0.5 tablets (15 mg total) by mouth 3 (three) times daily.  Post traumatic stress disorder (PTSD) -     busPIRone (BUSPAR) 30 MG tablet; Take 0.5 tablets (15 mg total) by mouth 3 (three) times daily.  Chronic pain of left ankle -     celecoxib (CELEBREX) 100 MG capsule; Take 1 capsule (100 mg total) by mouth 2 (two) times daily as needed for moderate pain. -     Uric acid -     CBC with Differential/Platelet -  Sedimentation rate -     C-reactive protein -     DG Ankle Complete Left -     DG Foot Complete Left  Peripheral polyneuropathy -     DULoxetine HCl 40 MG CPEP; Take 1 capsule by mouth 2 (two) times daily. -     COMPLETE METABOLIC PANEL WITH GFR -     B12 and Folate Panel -     Hemoglobin A1c -     Hepatitis C antibody -     TSH + free T4 -     Sedimentation rate -     C-reactive protein  Need for hepatitis C screening test -     Hepatitis C antibody  Screening for thyroid disorder -     TSH + free T4  Screening for diabetes mellitus -     Hemoglobin A1c  Status post left foot surgery -     DG Ankle Complete Left -     DG Foot Complete Left  Syndesmotic disruption of left ankle, sequela -     DG Ankle Complete Left -     DG Foot Complete Left  Pulmonary nodules Comments: 07/2017 eval w/Pulmonology: new small lung nodules seen on CT chest that measure about 3-4 mm. The risk of lung cancer is less than 2%. Repeat CT chest in 1 year  Abnormal findings on diagnostic imaging of lung -     CT CHEST NODULE FOLLOW UP LOW DOSE W/O; Future  Thyroid nodule -     US THYROID; Future   - Personally reviewed PMH, PSH, PFH, medications, allergies, HM - Personally reviewed records and diagnostic testing in Care Everywhere. Noted abnormal findings on CT chest and ordered follow-up imaging as above - Age-appropriate cancer screening: n/a - Influenza declined - Tdap UTD - PHQ2 positive  Peripheral  polyneuropathy of bilateral lower extremities Discussed possible etiology including alcohol induced neuropathy Reviewed lab results in care everywhere showing normal ESR, CRP, TSH, CMP in 2019 We will repeat neuropathy labs today and add B12 and folate panel as well as hemoglobin A1c Discussed that Cymbalta is an excellent medication for neuropathic pain and that we should continue this  Chronic left foot and ankle pain Personally reviewed records in care everywhere showing syndesmotic disruption of left ankle and history of left ankle arthroscopy Refilled Celebrex today Recommended consultation with sports medicine for possible complex regional pain syndrome Also checking uric acid level  PTSD/nightmares, mood disorder PHQ 9 equals 12, no acute safety issues Gad 7 equals 14 Increasing BuSpar to 30 mg 3 times daily Adding prazosin 1 mg at bedtime Continue trazodone 100 mg at bedtime.  Option to increase this in 1 month if sleep is still nonrestorative Should also consider referral to an E MDR therapist.  Geanie Cooley barrier will be his work schedule   Patient education and anticipatory guidance given Patient agrees with treatment plan Follow-up in 1 month or sooner as needed if symptoms worsen or fail to improve  Darlyne Russian PA-C

## 2018-10-05 NOTE — Patient Instructions (Signed)

## 2018-10-06 LAB — B12 AND FOLATE PANEL
Folate: 8 ng/mL
VITAMIN B 12: 289 pg/mL (ref 200–1100)

## 2018-10-06 LAB — COMPLETE METABOLIC PANEL WITH GFR
AG Ratio: 2 (calc) (ref 1.0–2.5)
ALT: 14 U/L (ref 9–46)
AST: 12 U/L (ref 10–40)
Albumin: 4.5 g/dL (ref 3.6–5.1)
Alkaline phosphatase (APISO): 77 U/L (ref 36–130)
BUN: 11 mg/dL (ref 7–25)
CO2: 27 mmol/L (ref 20–32)
Calcium: 9.8 mg/dL (ref 8.6–10.3)
Chloride: 104 mmol/L (ref 98–110)
Creat: 1.25 mg/dL (ref 0.60–1.35)
GFR, Est African American: 91 mL/min/{1.73_m2} (ref >=60)
GFR, Est Non African American: 78 mL/min/{1.73_m2} (ref >=60)
GLUCOSE: 79 mg/dL (ref 65–99)
Globulin: 2.3 g/dL (calc) (ref 1.9–3.7)
Potassium: 4.1 mmol/L (ref 3.5–5.3)
Sodium: 139 mmol/L (ref 135–146)
Total Bilirubin: 1.2 mg/dL (ref 0.2–1.2)
Total Protein: 6.8 g/dL (ref 6.1–8.1)

## 2018-10-06 LAB — CBC WITH DIFFERENTIAL/PLATELET
Absolute Monocytes: 525 cells/uL (ref 200–950)
BASOS PCT: 0.8 %
Basophils Absolute: 51 cells/uL (ref 0–200)
Eosinophils Absolute: 192 cells/uL (ref 15–500)
Eosinophils Relative: 3 %
HCT: 43.6 % (ref 38.5–50.0)
Hemoglobin: 15.2 g/dL (ref 13.2–17.1)
Lymphs Abs: 2048 cells/uL (ref 850–3900)
MCH: 30.3 pg (ref 27.0–33.0)
MCHC: 34.9 g/dL (ref 32.0–36.0)
MCV: 86.9 fL (ref 80.0–100.0)
MONOS PCT: 8.2 %
MPV: 12.7 fL — ABNORMAL HIGH (ref 7.5–12.5)
Neutro Abs: 3584 cells/uL (ref 1500–7800)
Neutrophils Relative %: 56 %
Platelets: 198 10*3/uL (ref 140–400)
RBC: 5.02 10*6/uL (ref 4.20–5.80)
RDW: 13 % (ref 11.0–15.0)
Total Lymphocyte: 32 %
WBC: 6.4 10*3/uL (ref 3.8–10.8)

## 2018-10-06 LAB — URIC ACID: Uric Acid, Serum: 8.5 mg/dL — ABNORMAL HIGH (ref 4.0–8.0)

## 2018-10-06 LAB — HEMOGLOBIN A1C
Hgb A1c MFr Bld: 5.5 % of total Hgb (ref <5.7)
Mean Plasma Glucose: 111 (calc)
eAG (mmol/L): 6.2 (calc)

## 2018-10-06 LAB — TSH+FREE T4: TSH W/REFLEX TO FT4: 1.37 m[IU]/L (ref 0.40–4.50)

## 2018-10-06 LAB — HEPATITIS C ANTIBODY
Hepatitis C Ab: NONREACTIVE
SIGNAL TO CUT-OFF: 0.01 (ref <1.00)

## 2018-10-06 LAB — SEDIMENTATION RATE: Sed Rate: 2 mm/h (ref 0–15)

## 2018-10-06 LAB — C-REACTIVE PROTEIN: CRP: 0.8 mg/L (ref <8.0)

## 2018-10-09 ENCOUNTER — Ambulatory Visit (INDEPENDENT_AMBULATORY_CARE_PROVIDER_SITE_OTHER): Payer: No Typology Code available for payment source

## 2018-10-09 ENCOUNTER — Other Ambulatory Visit: Payer: Self-pay | Admitting: Physician Assistant

## 2018-10-09 ENCOUNTER — Encounter: Payer: Self-pay | Admitting: Physician Assistant

## 2018-10-09 ENCOUNTER — Other Ambulatory Visit: Payer: Self-pay

## 2018-10-09 ENCOUNTER — Ambulatory Visit
Admission: RE | Admit: 2018-10-09 | Discharge: 2018-10-09 | Disposition: A | Discharge status: Home / Self Care | Payer: Self-pay | Source: Ambulatory Visit | Attending: Physician Assistant | Admitting: Physician Assistant

## 2018-10-09 DIAGNOSIS — R918 Other nonspecific abnormal finding of lung field: Secondary | ICD-10-CM

## 2018-10-09 DIAGNOSIS — E790 Hyperuricemia without signs of inflammatory arthritis and tophaceous disease: Secondary | ICD-10-CM

## 2018-10-09 DIAGNOSIS — E041 Nontoxic single thyroid nodule: Secondary | ICD-10-CM

## 2018-10-09 DIAGNOSIS — F419 Anxiety disorder, unspecified: Secondary | ICD-10-CM

## 2018-10-09 HISTORY — DX: Hyperuricemia without signs of inflammatory arthritis and tophaceous disease: E79.0

## 2018-10-09 MED ORDER — HYDROXYZINE HCL 25 MG PO TABS: 25.0000 mg | ORAL_TABLET | Freq: Three times a day (TID) | ORAL | 0 refills | Status: DC | PRN

## 2018-10-09 NOTE — Addendum Note (Signed)
Addended by: Gena Fray E on: 10/09/2018 09:46 PM   Modules accepted: Orders

## 2018-10-10 ENCOUNTER — Ambulatory Visit
Admission: RE | Admit: 2018-10-10 | Discharge: 2018-10-10 | Disposition: A | Discharge status: Home / Self Care | Payer: Self-pay | Source: Ambulatory Visit | Attending: Physician Assistant | Admitting: Physician Assistant

## 2018-10-11 ENCOUNTER — Encounter (INDEPENDENT_AMBULATORY_CARE_PROVIDER_SITE_OTHER): Payer: No Typology Code available for payment source | Admitting: Physician Assistant

## 2018-10-11 ENCOUNTER — Telehealth: Payer: No Typology Code available for payment source | Admitting: Family

## 2018-10-11 DIAGNOSIS — R05 Cough: Secondary | ICD-10-CM | POA: Diagnosis not present

## 2018-10-11 DIAGNOSIS — J069 Acute upper respiratory infection, unspecified: Secondary | ICD-10-CM

## 2018-10-11 DIAGNOSIS — J029 Acute pharyngitis, unspecified: Secondary | ICD-10-CM

## 2018-10-11 DIAGNOSIS — R6889 Other general symptoms and signs: Secondary | ICD-10-CM

## 2018-10-11 MED ORDER — FLUTICASONE PROPIONATE 50 MCG/ACT NA SUSP: 2.0000 | Freq: Every day | NASAL | 6 refills | Status: AC

## 2018-10-11 MED ORDER — BENZONATATE 100 MG PO CAPS: 100.0000 mg | ORAL_CAPSULE | Freq: Three times a day (TID) | ORAL | 0 refills | Status: DC | PRN

## 2018-10-11 NOTE — Progress Notes (Signed)
We are sorry you are not feeling well.  Here is how we plan to help!  Based on what you have shared with me, it looks like you may have a viral upper respiratory infection.  Upper respiratory infections are caused by a large number of viruses; however, rhinovirus is the most common cause.   Approximately 5 minutes was spent documenting and reviewing patient's chart.   Symptoms vary from person to person, with common symptoms including sore throat, cough, and fatigue or lack of energy.  A low-grade fever of up to 100.4 may present, but is often uncommon.  Symptoms vary however, and are closely related to a person's age or underlying illnesses.  The most common symptoms associated with an upper respiratory infection are nasal discharge or congestion, cough, sneezing, headache and pressure in the ears and face.  These symptoms usually persist for about 3 to 10 days, but can last up to 2 weeks.  It is important to know that upper respiratory infections do not cause serious illness or complications in most cases.    Upper respiratory infections can be transmitted from person to person, with the most common method of transmission being a person's hands.  The virus is able to live on the skin and can infect other persons for up to 2 hours after direct contact.  Also, these can be transmitted when someone coughs or sneezes; thus, it is important to cover the mouth to reduce this risk.  To keep the spread of the illness at bay, good hand hygiene is very important.  This is an infection that is most likely caused by a virus. There are no specific treatments other than to help you with the symptoms until the infection runs its course.  We are sorry you are not feeling well.  Here is how we plan to help!   For nasal congestion, you may use an oral decongestants such as Mucinex D or if you have glaucoma or high blood pressure use plain Mucinex.  Saline nasal spray or nasal drops can help and can safely be used as  often as needed for congestion.  For your congestion, I have prescribed Fluticasone nasal spray one spray in each nostril twice a day  If you do not have a history of heart disease, hypertension, diabetes or thyroid disease, prostate/bladder issues or glaucoma, you may also use Sudafed to treat nasal congestion.  It is highly recommended that you consult with a pharmacist or your primary care physician to ensure this medication is safe for you to take.     If you have a cough, you may use cough suppressants such as Delsym and Robitussin.  If you have glaucoma or high blood pressure, you can also use Coricidin HBP.   For cough I have prescribed for you A prescription cough medication called Tessalon Perles 100 mg. You may take 1-2 capsules every 8 hours as needed for cough.  If you have a sore or scratchy throat, use a saltwater gargle-  to  teaspoon of salt dissolved in a 4-ounce to 8-ounce glass of warm water.  Gargle the solution for approximately 15-30 seconds and then spit.  It is important not to swallow the solution.  You can also use throat lozenges/cough drops and Chloraseptic spray to help with throat pain or discomfort.  Warm or cold liquids can also be helpful in relieving throat pain.  For headache, pain or general discomfort, you can use Ibuprofen or Tylenol as directed.   Some authorities   believe that zinc sprays or the use of Echinacea may shorten the course of your symptoms.   HOME CARE . Only take medications as instructed by your medical team. . Be sure to drink plenty of fluids. Water is fine as well as fruit juices, sodas and electrolyte beverages. You may want to stay away from caffeine or alcohol. If you are nauseated, try taking small sips of liquids. How do you know if you are getting enough fluid? Your urine should be a pale yellow or almost colorless. . Get rest. . Taking a steamy shower or using a humidifier may help nasal congestion and ease sore throat pain. You can  place a towel over your head and breathe in the steam from hot water coming from a faucet. . Using a saline nasal spray works much the same way. . Cough drops, hard candies and sore throat lozenges may ease your cough. . Avoid close contacts especially the very young and the elderly . Cover your mouth if you cough or sneeze . Always remember to wash your hands.   GET HELP RIGHT AWAY IF: . You develop worsening fever. . If your symptoms do not improve within 10 days . You develop yellow or green discharge from your nose over 3 days. . You have coughing fits . You develop a severe head ache or visual changes. . You develop shortness of breath, difficulty breathing or start having chest pain . Your symptoms persist after you have completed your treatment plan  MAKE SURE YOU   Understand these instructions.  Will watch your condition.  Will get help right away if you are not doing well or get worse.  Your e-visit answers were reviewed by a board certified advanced clinical practitioner to complete your personal care plan. Depending upon the condition, your plan could have included both over the counter or prescription medications. Please review your pharmacy choice. If there is a problem, you may call our nursing hot line at and have the prescription routed to another pharmacy. Your safety is important to us. If you have drug allergies check your prescription carefully.   You can use MyChart to ask questions about today's visit, request a non-urgent call back, or ask for a work or school excuse for 24 hours related to this e-Visit. If it has been greater than 24 hours you will need to follow up with your provider, or enter a new e-Visit to address those concerns. You will get an e-mail in the next two days asking about your experience.  I hope that your e-visit has been valuable and will speed your recovery. Thank you for using e-visits.      

## 2018-10-12 MED ORDER — PREDNISONE 50 MG PO TABS: ORAL_TABLET | ORAL | 0 refills | Status: DC

## 2018-10-12 MED ORDER — ALBUTEROL SULFATE HFA 108 (90 BASE) MCG/ACT IN AERS: 1.0000 | INHALATION_SPRAY | RESPIRATORY_TRACT | 0 refills | Status: AC | PRN

## 2018-10-16 ENCOUNTER — Encounter: Payer: Self-pay | Admitting: Physician Assistant

## 2018-10-29 ENCOUNTER — Other Ambulatory Visit: Payer: Self-pay | Admitting: Physician Assistant

## 2018-10-29 DIAGNOSIS — G629 Polyneuropathy, unspecified: Secondary | ICD-10-CM

## 2018-10-30 ENCOUNTER — Encounter: Payer: Self-pay | Admitting: Physician Assistant

## 2018-11-02 ENCOUNTER — Ambulatory Visit (INDEPENDENT_AMBULATORY_CARE_PROVIDER_SITE_OTHER): Payer: No Typology Code available for payment source | Admitting: Physician Assistant

## 2018-11-02 ENCOUNTER — Institutional Professional Consult (permissible substitution): Payer: Self-pay | Admitting: Family Medicine

## 2018-11-02 ENCOUNTER — Encounter: Payer: Self-pay | Admitting: Physician Assistant

## 2018-11-02 DIAGNOSIS — F409 Phobic anxiety disorder, unspecified: Secondary | ICD-10-CM

## 2018-11-02 DIAGNOSIS — F515 Nightmare disorder: Secondary | ICD-10-CM

## 2018-11-02 DIAGNOSIS — F5105 Insomnia due to other mental disorder: Secondary | ICD-10-CM

## 2018-11-02 DIAGNOSIS — F4310 Post-traumatic stress disorder, unspecified: Secondary | ICD-10-CM

## 2018-11-02 MED ORDER — PRAZOSIN HCL 2 MG PO CAPS: 2.0000 mg | ORAL_CAPSULE | Freq: Every day | ORAL | 0 refills | Status: DC

## 2018-11-02 MED ORDER — TRAZODONE HCL 150 MG PO TABS: 150.0000 mg | ORAL_TABLET | Freq: Every day | ORAL | 0 refills | Status: DC

## 2018-11-02 NOTE — Progress Notes (Signed)
Virtual Visit via Video Note  I connected with [name] on 11/06/18 at  1:00 PM EDT by a video enabled telemedicine application and verified that I am speaking with the correct person using two identifiers.   I discussed the limitations of evaluation and management by telemedicine and the availability of in person appointments. The patient expressed understanding and agreed to proceed.  History of Present Illness:  11/02/2018 He states anxiety has improved with Hydroxyzine. He self-discontinued Buspirone because he did not find it helpful. Reports sleep is still a big issue. Reports 2-3 hours of fragmented sleep nightly with difficulty falling asleep and multiple nighttime awakenings. He is currently taking Prazosin 1 mg and Trazodone 100 mg nightly.  10/05/2018 PTSD/Mood disorder - currently taking Buspar 15 mg three times daily but feels like he has become tolerant to the medication after several years. He also takes Trazodone 100 mg at bedtime, but states this is no longer effective for him either.  Reports sleeping is horrible. He is having a lot of nightmares described as "horrific dreams," waking up crying out and sweating.  Finds himself feeling more anxious for no apparent reason.  He has a history of polysubstance abuse (amphetamines, opioids, alcohol) and has been sober for 5 years. He used to follow with Psychiatry (Dr. Gilmore LarocheAkhtar) and was on Seroquel 200 mg for a mood disorder at one time He also used to see a counselor, which he states was helpful, but he had to stop going due to conflicts with his work schedule  Past Medical History:  Diagnosis Date  . Allergic rhinitis   . Anxiety   . Depression   . Hyperuricemia 10/09/2018  . Low back pain   . Seizure disorder ([facility])   . Syndesmotic disruption of left ankle 05/24/2016   Overview:  Sports Medicine - Dr Jamelle HaringSnow Daws   Past Surgical History:  Procedure Laterality Date  . FRACTURE SURGERY Left    ankle   Social History    Tobacco Use  . Smoking status: Current Every Day Smoker    Packs/day: 0.50    Years: 4.00    Pack years: 2.00    Types: Cigarettes  . Smokeless tobacco: Current User    Types: Chew  Substance Use Topics  . Alcohol use: No    Alcohol/week: 0.0 standard drinks    Comment: sober since 2015   family history includes Alcohol abuse in his brother and maternal uncle; Autoimmune disease in his mother; Bone cancer in his maternal grandfather; Breast cancer in his maternal aunt; Depression in his maternal aunt; Diabetes in an other family member; Fibromyalgia in his mother; Pancreatic cancer in his paternal grandfather.    ROS: negative except as noted in the HPI  Medications: Current Outpatient Medications  Medication Sig Dispense Refill  . albuterol (PROVENTIL HFA;VENTOLIN HFA) 108 (90 Base) MCG/ACT inhaler Inhale 1-2 puffs into the lungs every 4 (four) hours as needed for wheezing or shortness of breath (bronchospasm). 1 Inhaler 0  . celecoxib (CELEBREX) 100 MG capsule Take 1 capsule (100 mg total) by mouth 2 (two) times daily as needed for moderate pain. 60 capsule 1  . DULoxetine HCl 40 MG CPEP Take 1 capsule by mouth 2 (two) times daily. 180 capsule 0  . fluticasone (FLONASE) 50 MCG/ACT nasal spray Place 2 sprays into both nostrils daily. 16 g 6  . hydrOXYzine (ATARAX/VISTARIL) 25 MG tablet Take 1 tablet (25 mg total) by mouth 3 (three) times daily as needed for anxiety. 60 tablet  0  . prazosin (MINIPRESS) 2 MG capsule Take 1 capsule (2 mg total) by mouth at bedtime. 90 capsule 0  . traZODone (DESYREL) 150 MG tablet Take 1 tablet (150 mg total) by mouth at bedtime. 90 tablet 0   No current facility-administered medications for this visit.    No Known Allergies     Objective:  There were no vitals taken for this visit. Gen:  alert, not ill-appearing, no distress, appropriate for age HEENT: head normocephalic without obvious abnormality, conjunctiva and cornea clear, trachea  midline Pulm: Normal work of breathing, normal phonation, speaking in full sentences Neuro: alert and oriented x 3 Psych: cooperative, euthymic mood, speech is articulate, normal rate and volume; thought processes clear and goal-directed, normal judgment, good insight    No results found for this or any previous visit (from the past 72 hour(s)). No results found.    Assessment and Plan: 28 y.o. male with   .Diagnoses and all orders for this visit:  Insomnia due to anxiety and fear -     traZODone (DESYREL) 150 MG tablet; Take 1 tablet (150 mg total) by mouth at bedtime.  Nightmare disorder -     prazosin (MINIPRESS) 2 MG capsule; Take 1 capsule (2 mg total) by mouth at bedtime.  Self-discontinued Buspirone, but doing well on Hydroxyzine tid prn Increasing Prazosin to 2 mg and Trazodone to 150 mg Follow-up in 1 week  Follow Up Instructions:    I discussed the assessment and treatment plan with the patient. The patient was provided an opportunity to ask questions and all were answered. The patient agreed with the plan and demonstrated an understanding of the instructions.   The patient was advised to call back or seek an in-person evaluation if the symptoms worsen or if the condition fails to improve as anticipated.  I provided 5-10 minutes of non-face-to-face time during this encounter.   [name], [place]

## 2018-11-06 ENCOUNTER — Encounter: Payer: Self-pay | Admitting: Physician Assistant

## 2018-11-06 ENCOUNTER — Institutional Professional Consult (permissible substitution): Payer: No Typology Code available for payment source | Admitting: Family Medicine

## 2018-11-06 DIAGNOSIS — F5105 Insomnia due to other mental disorder: Secondary | ICD-10-CM

## 2018-11-06 DIAGNOSIS — F409 Phobic anxiety disorder, unspecified: Secondary | ICD-10-CM | POA: Insufficient documentation

## 2018-11-06 NOTE — Progress Notes (Deleted)
   Subjective:    I'm seeing this patient as a consultation for:  Ricky Stable, PA-C   CC: ***  HPI: ***  Past medical history, Surgical history, Family history not pertinant except as noted below, Social history, Allergies, and medications have been entered into the medical record, reviewed, and no changes needed.   Review of Systems: No headache, visual changes, nausea, vomiting, diarrhea, constipation, dizziness, abdominal pain, skin rash, fevers, chills, night sweats, weight loss, swollen lymph nodes, body aches, joint swelling, muscle aches, chest pain, shortness of breath, mood changes, visual or auditory hallucinations.   Objective:   There were no vitals filed for this visit. General: Well Developed, well nourished, and in no acute distress.  Neuro/Psych: Alert and oriented x3, extra-ocular muscles intact, able to move all 4 extremities, sensation grossly intact. Skin: Warm and dry, no rashes noted.  Respiratory: Not using accessory muscles, speaking in full sentences, trachea midline.  Cardiovascular: Pulses palpable, no extremity edema. Abdomen: Does not appear distended. MSK: ***  Lab and Radiology Results No results found for this or any previous visit (from the past 72 hour(s)). No results found.  Impression and Recommendations:    Assessment and Plan: 28 y.o. male with ***.  PDMP not reviewed this encounter. No orders of the defined types were placed in this encounter.  No orders of the defined types were placed in this encounter.   Discussed warning signs or symptoms. Please see discharge instructions. Patient expresses understanding.

## 2018-11-06 NOTE — Patient Instructions (Signed)

## 2018-11-09 ENCOUNTER — Ambulatory Visit (INDEPENDENT_AMBULATORY_CARE_PROVIDER_SITE_OTHER): Payer: No Typology Code available for payment source | Admitting: Physician Assistant

## 2018-11-09 ENCOUNTER — Encounter: Payer: Self-pay | Admitting: Physician Assistant

## 2018-11-09 ENCOUNTER — Other Ambulatory Visit: Payer: Self-pay | Admitting: Physician Assistant

## 2018-11-09 DIAGNOSIS — F409 Phobic anxiety disorder, unspecified: Secondary | ICD-10-CM

## 2018-11-09 DIAGNOSIS — E538 Deficiency of other specified B group vitamins: Secondary | ICD-10-CM

## 2018-11-09 DIAGNOSIS — F4310 Post-traumatic stress disorder, unspecified: Secondary | ICD-10-CM

## 2018-11-09 DIAGNOSIS — F5105 Insomnia due to other mental disorder: Secondary | ICD-10-CM

## 2018-11-09 DIAGNOSIS — G629 Polyneuropathy, unspecified: Secondary | ICD-10-CM

## 2018-11-09 DIAGNOSIS — F515 Nightmare disorder: Secondary | ICD-10-CM

## 2018-11-09 DIAGNOSIS — F39 Unspecified mood [affective] disorder: Secondary | ICD-10-CM

## 2018-11-09 MED ORDER — TOPIRAMATE 25 MG PO TABS: ORAL_TABLET | ORAL | 2 refills | Status: DC

## 2018-11-09 MED ORDER — B-12 250 MCG PO TABS: 250.0000 ug | ORAL_TABLET | Freq: Every day | ORAL | 3 refills | Status: AC

## 2018-11-09 MED ORDER — DULOXETINE HCL 40 MG PO CPEP: 1.0000 | ORAL_CAPSULE | Freq: Two times a day (BID) | ORAL | 0 refills | Status: DC

## 2018-11-09 NOTE — Progress Notes (Signed)
Virtual Visit via Video Note  I connected with Ricky Foster on 11/16/18 at 10:30 AM EDT by a video enabled telemedicine application and verified that I am speaking with the correct person using two identifiers.   I discussed the limitations of evaluation and management by telemedicine and the availability of in person appointments. The patient expressed understanding and agreed to proceed.  History of Present Illness: HPI:                                                                Ricky Foster is a 28 y.o. male   CC: insomnia f/u  11/09/2018 1 week ago  Sleep improved Having 3-4 nighttime awakenings, improved from 5+ Sleeping 5-6 hrs, still fragmented, but improved from 2-3 Nightmares are no longer nightly    11/02/2018 He states anxiety has improved with Hydroxyzine. He self-discontinued Buspirone because he did not find it helpful. Reports sleep is still a big issue. Reports 2-3 hours of fragmented sleep nightly with difficulty falling asleep and multiple nighttime awakenings (5+) He is currently taking Prazosin 1 mg and Trazodone 100 mg nightly.  10/05/2018 PTSD/Mood disorder - currently taking Buspar 15 mg three times daily but feels like he has become tolerant to the medication after several years. He also takes Trazodone 100 mg at bedtime, but states this is no longer effective for him either. Reports sleeping is horrible. He is having a lot of nightmares described as "horrific dreams," waking up crying out and sweating.  Finds himself feeling more anxiousfor no apparent reason.  He has a history of polysubstance abuse (amphetamines, opioids, alcohol) and has been sober for 5 years. He used to follow with Psychiatry (Dr. Gilmore Laroche) and was on Seroquel 200 mg for a mood disorder at one time He also used to see a counselor, which he states was helpful, but he had to stop going due to conflicts with his work schedule   Chronic neuropathic pain - onset 4-5 years ago.  Still having burning pain and numbness in his legs and feet that was worse in his toes this week. Symptoms are worse with prolonged standing and this is affecting his productivity at work.  Currently takes Cymbalta 40 mg bid for this. In the past he took Gabapentin, but it became ineffective. Admits to history of heavy alcohol consumption for several years. Has been sober for the last 5 years.  Past Medical History:  Diagnosis Date  . Allergic rhinitis   . Anxiety   . Depression   . Hyperuricemia 10/09/2018  . Low back pain   . Seizure disorder (HCC)   . Syndesmotic disruption of left ankle 05/24/2016   Overview:  Sports Medicine - Dr Jamelle Haring Daws   Past Surgical History:  Procedure Laterality Date  . FRACTURE SURGERY Left    ankle   Social History   Tobacco Use  . Smoking status: Current Every Day Smoker    Packs/day: 0.50    Years: 4.00    Pack years: 2.00    Types: Cigarettes  . Smokeless tobacco: Current User    Types: Chew  Substance Use Topics  . Alcohol use: No    Alcohol/week: 0.0 standard drinks    Comment: sober since 2015   family history includes Alcohol abuse in his  brother and maternal uncle; Autoimmune disease in his mother; Bone cancer in his maternal grandfather; Breast cancer in his maternal aunt; Depression in his maternal aunt; Diabetes in an other family member; Fibromyalgia in his mother; Pancreatic cancer in his paternal grandfather.    ROS: negative except as noted in the HPI  Medications: Current Outpatient Medications  Medication Sig Dispense Refill  . albuterol (PROVENTIL HFA;VENTOLIN HFA) 108 (90 Base) MCG/ACT inhaler Inhale 1-2 puffs into the lungs every 4 (four) hours as needed for wheezing or shortness of breath (bronchospasm). 1 Inhaler 0  . celecoxib (CELEBREX) 100 MG capsule Take 1 capsule (100 mg total) by mouth 2 (two) times daily as needed for moderate pain. 60 capsule 1  . fluticasone (FLONASE) 50 MCG/ACT nasal spray Place 2 sprays into  both nostrils daily. 16 g 6  . hydrOXYzine (ATARAX/VISTARIL) 25 MG tablet Take 1 tablet (25 mg total) by mouth 3 (three) times daily as needed for anxiety. 60 tablet 0  . prazosin (MINIPRESS) 2 MG capsule Take 1 capsule (2 mg total) by mouth at bedtime. 90 capsule 0  . traZODone (DESYREL) 150 MG tablet Take 1 tablet (150 mg total) by mouth at bedtime. 90 tablet 0  . Cyanocobalamin (B-12) 250 MCG TABS Take 250 mcg by mouth daily. 90 each 3  . DULoxetine HCl 40 MG CPEP Take 1 capsule by mouth 2 (two) times daily. 180 capsule 0  . topiramate (TOPAMAX) 25 MG tablet Take 1 tablet (25 mg total) by mouth at bedtime for 7 days, THEN 1 tablet (25 mg total) 2 (two) times daily for 23 days. 60 tablet 2   No current facility-administered medications for this visit.    No Known Allergies     Objective:  There were no vitals taken for this visit. Pulm: Normal work of breathing, normal phonation, speaking in full sentences Neuro: alert and oriented x 3 Psych: cooperative, euthymic mood, affect mood-congruent, speech is articulate, normal rate and volume; thought processes clear and goal-directed, normal judgment, good insight   Lab Results  Component Value Date   WBC 6.4 10/05/2018   HGB 15.2 10/05/2018   HCT 43.6 10/05/2018   MCV 86.9 10/05/2018   PLT 198 10/05/2018   No results found for: TSH No results found for: TSH, T3TOTAL, T4TOTAL, THYROIDAB  Lab Results  Component Value Date   ESRSEDRATE 2 10/05/2018   Lab Results  Component Value Date   CRP 0.8 10/05/2018   Lab Results  Component Value Date   VITAMINB12 289 10/05/2018     No results found for this or any previous visit (from the past 72 hour(s)). No results found.    Assessment and Plan: 28 y.o. male with  .Ricky Foster was seen today for follow-up.  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 -     Cyanocobalamin (B-12) 250 MCG TABS; Take 250 mcg by mouth daily.  Low serum vitamin B12 -     Cyanocobalamin (B-12) 250 MCG TABS; Take 250 mcg by mouth daily.   PTSD/Nightmare disorder - responded well to increased dose of Prazosin and Trazodone. Cont current  medications. Follow-up in 1 month  Chronic neuropathic pain of lower extremities - unclear etiology, possibly related to prior heavy alcohol consumption. Lab work-up unremarkable apart from low normal B12.  Failed Gabapentin On max dose Duloxetine Controlled medication caution due to hx of polysubstance abuse Start Topamax and B12 supplement Refer to Neuro for work-up and further management  Follow Up Instructions:    I discussed the assessment and treatment plan with the patient. The patient was provided an opportunity to ask questions and all were answered. The patient agreed with the plan and demonstrated an understanding of the instructions.   The patient was advised to call back or seek an in-person evaluation if the symptoms worsen or if the condition fails to improve as anticipated.  I provided 11-20 minutes of non-face-to-face time during this encounter.   [name], [place]

## 2018-11-14 ENCOUNTER — Encounter: Payer: Self-pay | Admitting: Physician Assistant

## 2018-11-16 DIAGNOSIS — E538 Deficiency of other specified B group vitamins: Secondary | ICD-10-CM | POA: Insufficient documentation

## 2018-11-28 ENCOUNTER — Encounter: Payer: Self-pay | Admitting: Physician Assistant

## 2018-11-28 ENCOUNTER — Encounter: Payer: Self-pay | Admitting: *Deleted

## 2018-11-28 ENCOUNTER — Telehealth: Payer: Self-pay | Admitting: *Deleted

## 2018-11-28 NOTE — Telephone Encounter (Signed)
Called patient and updated EMR. 

## 2018-11-29 ENCOUNTER — Encounter: Payer: Self-pay | Admitting: Physician Assistant

## 2018-11-29 ENCOUNTER — Other Ambulatory Visit: Payer: Self-pay | Admitting: Physician Assistant

## 2018-11-29 ENCOUNTER — Other Ambulatory Visit: Payer: Self-pay

## 2018-11-29 ENCOUNTER — Ambulatory Visit: Payer: No Typology Code available for payment source | Admitting: Diagnostic Neuroimaging

## 2018-11-29 DIAGNOSIS — F515 Nightmare disorder: Secondary | ICD-10-CM

## 2018-11-29 DIAGNOSIS — F419 Anxiety disorder, unspecified: Secondary | ICD-10-CM

## 2018-11-29 MED ORDER — HYDROXYZINE HCL 25 MG PO TABS: 25.0000 mg | ORAL_TABLET | Freq: Three times a day (TID) | ORAL | 0 refills | Status: AC | PRN

## 2018-11-29 NOTE — Telephone Encounter (Signed)
Patient scheduled another appointment with [facility] does he want to keep that appointment or just have a find a new neuro if it doesn' work again?

## 2018-11-30 NOTE — Telephone Encounter (Signed)
It appears he would like a new office based on the bad experience.

## 2018-11-30 NOTE — Telephone Encounter (Signed)
Patient sent the same message twice I messaged him back to see exactly what he wanted since he scheduled with same doctor for another appointment - CF

## 2018-12-04 ENCOUNTER — Encounter: Payer: Self-pay | Admitting: Diagnostic Neuroimaging

## 2018-12-04 ENCOUNTER — Other Ambulatory Visit: Payer: Self-pay

## 2018-12-04 ENCOUNTER — Ambulatory Visit (INDEPENDENT_AMBULATORY_CARE_PROVIDER_SITE_OTHER): Payer: No Typology Code available for payment source | Admitting: Diagnostic Neuroimaging

## 2018-12-04 DIAGNOSIS — M792 Neuralgia and neuritis, unspecified: Secondary | ICD-10-CM

## 2018-12-04 DIAGNOSIS — R200 Anesthesia of skin: Secondary | ICD-10-CM

## 2018-12-04 MED ORDER — GABAPENTIN 300 MG PO CAPS: 300.0000 mg | ORAL_CAPSULE | Freq: Three times a day (TID) | ORAL | 11 refills | Status: DC

## 2018-12-04 NOTE — Progress Notes (Signed)
[facility]  PATIENT: Ricky Foster DOB: [birth year]  REFERRING CLINICIAN: Randalyn Rhea HISTORY FROM: patient  REASON FOR VISIT: new consult    HISTORICAL  CHIEF COMPLAINT:  Chief Complaint  Patient presents with  . Numbness    pain in hands and legs    HISTORY OF PRESENT ILLNESS:   28 year old male with history of substance abuse and alcohol abuse, sober since 2015, here for evaluation of numbness and pain in hands and feet since 2019.  Patient reports gradual onset progressive numbness and burning pain from his thighs down to his feet.  This has progressed to mainly discomfort from his knees down to his toes.  He also has some numbness tingling in his hands and fingers.  He has some low back pain.  He denies any abnormal sensations in his face, vision, speech or swallowing.    Patient was on gabapentin in 2015 as he was going through rehabilitation from substance abuse.  He continued on this until September 2019 and then stop gabapentin as it was no longer needed from his psychiatry standpoint.  Of note while he was on gabapentin this did not help with his neuropathic pain.  He has been put on Cymbalta in 2020 for neuropathy pain control with mild complete relief.  He was more recently started on topiramate for neuropathy control without relief.    REVIEW OF SYSTEMS: Full 14 system review of systems performed and negative with exception of: As per HPI.  ALLERGIES: No Known Allergies  HOME MEDICATIONS: Outpatient Medications Prior to Visit  Medication Sig Dispense Refill  . albuterol (PROVENTIL HFA;VENTOLIN HFA) 108 (90 Base) MCG/ACT inhaler Inhale 1-2 puffs into the lungs every 4 (four) hours as needed for wheezing or shortness of breath (bronchospasm). 1 Inhaler 0  . celecoxib (CELEBREX) 100 MG capsule Take 1 capsule (100 mg total) by mouth 2 (two) times daily as needed for moderate pain. 60 capsule 1  . Cyanocobalamin (B-12) 250 MCG TABS Take 250 mcg  by mouth daily. 90 each 3  . DULoxetine HCl 40 MG CPEP Take 1 capsule by mouth 2 (two) times daily. 180 capsule 0  . fluticasone (FLONASE) 50 MCG/ACT nasal spray Place 2 sprays into both nostrils daily. 16 g 6  . hydrOXYzine (ATARAX/VISTARIL) 25 MG tablet Take 1 tablet (25 mg total) by mouth 3 (three) times daily as needed for anxiety. 60 tablet 0  . prazosin (MINIPRESS) 2 MG capsule Take 1 capsule (2 mg total) by mouth at bedtime. 90 capsule 0  . topiramate (TOPAMAX) 25 MG tablet Take 1 tablet (25 mg total) by mouth at bedtime for 7 days, THEN 1 tablet (25 mg total) 2 (two) times daily for 23 days. 60 tablet 2  . traZODone (DESYREL) 150 MG tablet Take 1 tablet (150 mg total) by mouth at bedtime. 90 tablet 0   No facility-administered medications prior to visit.     PAST MEDICAL HISTORY: Past Medical History:  Diagnosis Date  . Allergic rhinitis   . Anxiety   . Depression   . Hyperuricemia 10/09/2018  . Low back pain   . PTSD (post-traumatic stress disorder)   . Seizure disorder (HCC)   . Syndesmotic disruption of left ankle 05/24/2016   Overview:  Sports Medicine - Dr Jamelle Haring Daws    PAST SURGICAL HISTORY: Past Surgical History:  Procedure Laterality Date  . FRACTURE SURGERY Left    ankle    FAMILY HISTORY: Family History  Problem Relation Age of Onset  .  Depression Maternal Aunt   . Breast cancer Maternal Aunt   . Alcohol abuse Maternal Uncle   . Fibromyalgia Mother   . Autoimmune disease Mother   . Alcohol abuse Brother   . Bone cancer Maternal Grandfather   . Pancreatic cancer Paternal Grandfather   . Diabetes Other     SOCIAL HISTORY: Social History   Socioeconomic History  . Marital status: Single    Spouse name: Not on file  . Number of children: Not on file  . Years of education: Not on file  . Highest education level: Not on file  Occupational History  . Not on file  Social Needs  . Financial resource strain: Not on file  . Food insecurity:    Worry:  Not on file    Inability: Not on file  . Transportation needs:    Medical: Not on file    Non-medical: Not on file  Tobacco Use  . Smoking status: Current Every Day Smoker    Packs/day: 0.50    Years: 4.00    Pack years: 2.00    Types: Cigarettes  . Smokeless tobacco: Current User    Types: Chew  Substance and Sexual Activity  . Alcohol use: No    Alcohol/week: 0.0 standard drinks    Comment: sober since 2015  . Drug use: Not Currently    Types: Amphetamines, Cocaine, Marijuana    Comment: sober since 2015  . Sexual activity: Yes    Birth control/protection: Condom  Lifestyle  . Physical activity:    Days per week: Not on file    Minutes per session: Not on file  . Stress: Not on file  Relationships  . Social connections:    Talks on phone: Not on file    Gets together: Not on file    Attends religious service: Not on file    Active member of club or organization: Not on file    Attends meetings of clubs or organizations: Not on file    Relationship status: Not on file  . Intimate partner violence:    Fear of current or ex partner: Not on file    Emotionally abused: Not on file    Physically abused: Not on file    Forced sexual activity: Not on file  Other Topics Concern  . Not on file  Social History Narrative   Caffeine- sodas 4-5 daily      PHYSICAL EXAM    VIDEO EXAM  GENERAL EXAM/CONSTITUTIONAL:  Vitals: There were no vitals filed for this visit.  There is no height or weight on file to calculate BMI. Wt Readings from Last 3 Encounters:  10/05/18 250 lb (113.4 kg)  04/08/16 281 lb (127.5 kg)  03/03/16 282 lb (127.9 kg)     Patient is in no distress; well developed, nourished and groomed; neck is supple   NEUROLOGIC: MENTAL STATUS:  No flowsheet data found.  awake, alert, oriented to person, place and time  recent and remote memory intact  normal attention and concentration  language fluent, comprehension intact, naming intact  fund  of knowledge appropriate  CRANIAL NERVE:   2nd, 3rd, 4th, 6th - visual fields full to confrontation, extraocular muscles intact, no nystagmus  5th - facial sensation --> DECR IN LEFT FACE  7th - facial strength symmetric  8th - hearing intact  11th - shoulder shrug symmetric  12th - tongue protrusion midline  MOTOR:   NO TREMOR; NO DRIFT IN BUE  SENSORY:   normal  and symmetric to light touch; EXCEPT DECR IN LEFT HAND  RIGHT LEG BURNING SENSATION  COORDINATION:   fine finger movements normal     DIAGNOSTIC DATA (LABS, IMAGING, TESTING) - I reviewed patient records, labs, notes, testing and imaging myself where available.  Lab Results  Component Value Date   WBC 6.4 10/05/2018   HGB 15.2 10/05/2018   HCT 43.6 10/05/2018   MCV 86.9 10/05/2018   PLT 198 10/05/2018      Component Value Date/Time   NA 139 10/05/2018 1112   K 4.1 10/05/2018 1112   CL 104 10/05/2018 1112   CO2 27 10/05/2018 1112   GLUCOSE 79 10/05/2018 1112   BUN 11 10/05/2018 1112   CREATININE 1.25 10/05/2018 1112   CALCIUM 9.8 10/05/2018 1112   PROT 6.8 10/05/2018 1112   AST 12 10/05/2018 1112   ALT 14 10/05/2018 1112   BILITOT 1.2 10/05/2018 1112   GFRNONAA 78 10/05/2018 1112   GFRAA 91 10/05/2018 1112   No results found for: CHOL, HDL, LDLCALC, LDLDIRECT, TRIG, CHOLHDL Lab Results  Component Value Date   HGBA1C 5.5 10/05/2018   Lab Results  Component Value Date   VITAMINB12 289 10/05/2018   No results found for: TSH     ASSESSMENT AND PLAN  28 y.o. year old male here with patchy multifocal pain, numbness, burning sensation in hands, legs and feet.  Suspect peripheral neuropathy, possibly related to prior chronic alcohol substance abuse.  Will check MRI of the brain to rule out other secondary causes such as demyelinating disease.  Would recommend to try combination of gabapentin with Cymbalta to see if this helps with neuropathic pain control.   Dx: ? Neuropathy   1.  Neuropathic pain   2. Numbness     PLAN:  NUMBNESS / PARESTHESIAS - add on gabapentin to cymbalta - check MRI brain (rule out demyelinating disease) - consider EMG/NCS in future; then may consider additional neuropathy labs   Orders Placed This Encounter  Procedures  . MR BRAIN W WO CONTRAST   Meds ordered this encounter  Medications  . gabapentin (NEURONTIN) 300 MG capsule    Sig: Take 1 capsule (300 mg total) by mouth 3 (three) times daily.    Dispense:  90 capsule    Refill:  11   Return in about 4 months (around 04/06/2019).    Suanne MarkerVIKRAM R. Brodin Gelpi, MD 12/04/2018, 11:19 AM Certified in Neurology, Neurophysiology and Neuroimaging  [facility] [address], [address] [place], [place] [address] [phone]

## 2018-12-08 ENCOUNTER — Encounter: Payer: Self-pay | Admitting: Physician Assistant

## 2018-12-10 ENCOUNTER — Encounter: Payer: Self-pay | Admitting: Physician Assistant

## 2018-12-10 ENCOUNTER — Emergency Department (INDEPENDENT_AMBULATORY_CARE_PROVIDER_SITE_OTHER)
Admission: EM | Admit: 2018-12-10 | Discharge: 2018-12-10 | Disposition: A | Discharge status: Home / Self Care | Payer: No Typology Code available for payment source | Source: Home / Self Care

## 2018-12-10 ENCOUNTER — Other Ambulatory Visit: Payer: Self-pay

## 2018-12-10 DIAGNOSIS — L509 Urticaria, unspecified: Secondary | ICD-10-CM

## 2018-12-10 MED ORDER — CETIRIZINE HCL 10 MG PO TABS: 10.0000 mg | ORAL_TABLET | Freq: Every day | ORAL | 0 refills | Status: AC

## 2018-12-10 MED ORDER — METHYLPREDNISOLONE SODIUM SUCC 40 MG IJ SOLR
80.0000 mg | Freq: Once | INTRAMUSCULAR | Status: AC
  Administered 2018-12-10: 80 mg via INTRAMUSCULAR

## 2018-12-10 MED ORDER — FAMOTIDINE 20 MG PO TABS
20.0000 mg | ORAL_TABLET | Freq: Once | ORAL | Status: AC
  Administered 2018-12-10: 20 mg via ORAL

## 2018-12-10 MED ORDER — TRIAMCINOLONE ACETONIDE 0.1 % EX CREA: 1.0000 "application " | TOPICAL_CREAM | Freq: Two times a day (BID) | CUTANEOUS | 0 refills | Status: AC

## 2018-12-10 MED ORDER — PREDNISONE 50 MG PO TABS: 50.0000 mg | ORAL_TABLET | Freq: Every day | ORAL | 0 refills | Status: AC

## 2018-12-10 NOTE — ED Provider Notes (Signed)
[facility]    CSN: [id number] Arrival date & time: 12/10/18  1513     History   Chief Complaint Chief Complaint  Patient presents with  . Rash    HPI Ricky Foster is a 28 y.o. male.   HPI Ricky Foster is a 28 y.o. male presenting to UC with c/o gradually worsening red itchy rash that started on his lower back last night and has since spread across his back, arms, and a few spots on his legs.  He took 2 benadryl around 1PM w/o relief.  Itching is moderate to severe at times. Denies throat pain, itching or oral swelling. No SOB. No known allergies. Denies new soaps, lotions, or food.  He does note his neurologist re-started him on gabapentin 5 days ago but he has taken this medication in the past w/o reaction.  He does not recall being around any poison ivy or similar plants. 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 No contact with others with similar rash. He does note his dad and him learned there was a new rash appearing in children with Covid-19 so this caused him to become concerned. Denies cough, congestion, SOB. No known exposure to Covid-19 and no recent travel.    Past Medical History:  Diagnosis Date  . Allergic rhinitis   . Anxiety   . Depression   . Hyperuricemia 10/09/2018  . Low back pain   . PTSD (post-traumatic stress disorder)   . Seizure disorder (HCC)   . Syndesmotic disruption of left ankle 05/24/2016   Overview:  Sports Medicine - Dr Jamelle HaringSnow Daws    Patient Active Problem List   Diagnosis Date Noted  . Low serum vitamin B12 11/16/2018  . 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 Hyperuricemia 10/09/2018  . 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 Chronic pain of left ankle 10/05/2018  . Peripheral polyneuropathy 10/05/2018  . Status post left foot surgery 10/05/2018  . Pulmonary nodules 10/05/2018  . Abnormal findings on diagnostic imaging  of lung 10/05/2018  . Right thyroid nodule 10/05/2018  . Nicotine dependence, uncomplicated 10/05/2018  . Syndesmotic disruption of left ankle 05/24/2016  . H/O colonoscopy 11/24/2015  . Central sleep apnea due to medical condition 05/30/2015  . History of seizures 05/30/2015  . Mood disorder in conditions classified elsewhere 08/23/2014  . PTSD (post-traumatic stress disorder) 08/23/2014  . History of substance use disorder 02/21/2014  . Opioid abuse, in remission (HCC) 02/21/2014  . Mood disorder (HCC) 02/21/2014  . Allergic rhinitis 10/14/2011  . History of asthma 10/14/2011  . Low back pain 10/14/2011    Past Surgical History:  Procedure Laterality Date  . FRACTURE SURGERY Left    ankle       Home Medications    Prior to Admission medications   Medication Sig Start Date End Date Taking? Authorizing Provider  albuterol (PROVENTIL HFA;VENTOLIN HFA) 108 (90 Base) MCG/ACT inhaler Inhale 1-2 puffs into the lungs every 4 (four) hours as needed for wheezing or shortness of breath (bronchospasm). 10/12/18   Carlis Stableummings, Charley Elizabeth, PA-C  celecoxib (CELEBREX) 100 MG capsule Take 1 capsule (100 mg total) by mouth 2 (two) times daily as needed for moderate pain. 10/05/18   Carlis Stableummings, Charley Elizabeth, PA-C  cetirizine (ZYRTEC) 10 MG tablet Take 1 tablet (10 mg total) by mouth daily. 12/10/18   Lurene ShadowPhelps, Langley Ingalls O, PA-C  Cyanocobalamin (  B-12) 250 MCG TABS Take 250 mcg by mouth daily. 11/09/18   [name], PA-C  DULoxetine HCl 40 MG CPEP Take 1 capsule by mouth 2 (two) times daily. 11/09/18   [name], PA-C  fluticasone [facility]) 50 MCG/ACT nasal spray Place 2 sprays into both nostrils daily. 10/11/18   Junie Spencer, FNP  gabapentin (NEURONTIN) 300 MG capsule Take 1 capsule (300 mg total) by mouth 3 (three) times daily. 12/04/18   Penumalli, Glenford Bayley, MD  hydrOXYzine (ATARAX/VISTARIL) 25 MG tablet Take 1 tablet (25 mg total) by mouth 3 (three) times daily  as needed for anxiety. 11/29/18   [name], PA-C  prazosin (MINIPRESS) 2 MG capsule Take 1 capsule (2 mg total) by mouth at bedtime. 11/02/18   [name], PA-C  predniSONE (DELTASONE) 50 MG tablet Take 1 tablet (50 mg total) by mouth daily with breakfast for 5 days. 12/10/18 12/15/18  Lurene Shadow, PA-C  topiramate (TOPAMAX) 25 MG tablet Take 1 tablet (25 mg total) by mouth at bedtime for 7 days, THEN 1 tablet (25 mg total) 2 (two) times daily for 23 days. 11/09/18 12/09/18  [name], PA-C  traZODone (DESYREL) 150 MG tablet Take 1 tablet (150 mg total) by mouth at bedtime. 11/02/18   [name], PA-C  triamcinolone cream (KENALOG) 0.1 % Apply 1 application topically 2 (two) times daily. 12/10/18   Lurene Shadow, PA-C  buPROPion (WELLBUTRIN) 100 MG tablet Take 1 tablet (100 mg total) by mouth daily. 03/03/16 04/08/16  Thresa Ross, MD    Family History Family History  Problem Relation Age of Onset  . Depression Maternal Aunt   . Breast cancer Maternal Aunt   . Alcohol abuse Maternal Uncle   . Fibromyalgia Mother   . Autoimmune disease Mother   . Alcohol abuse Brother   . Bone cancer Maternal Grandfather   . Pancreatic cancer Paternal Grandfather   . Diabetes Other     Social History Social History   Tobacco Use  . Smoking status: Current Every Day Smoker    Packs/day: 0.50    Years: 4.00    Pack years: 2.00    Types: Cigarettes  . Smokeless tobacco: Current User    Types: Chew  Substance Use Topics  . Alcohol use: No    Alcohol/week: 0.0 standard drinks    Comment: sober since 2015  . Drug use: Not Currently    Types: Amphetamines, Cocaine, Marijuana    Comment: sober since 2015     Allergies   Patient has no known allergies.   Review of Systems Review of Systems  Constitutional: Negative for chills and fever.  Respiratory: Negative for chest tightness, shortness of breath, wheezing and stridor.    Cardiovascular: Negative for chest pain and palpitations.  Gastrointestinal: Negative for abdominal pain, diarrhea, nausea and vomiting.  Musculoskeletal: Negative for arthralgias and myalgias.  Skin: Positive for rash. Negative for wound.     Physical Exam Triage Vital Signs ED Triage Vitals  Enc Vitals Group     BP 12/10/18 1526 131/76     Pulse Rate 12/10/18 1526 (!) 102     Resp 12/10/18 1526 20     Temp 12/10/18 1526 (!) 97.5 F (36.4 C)     Temp Source 12/10/18 1526 Tympanic     SpO2 12/10/18 1526 98 %     Weight 12/10/18 1527 264 lb (119.7 kg)     Height 12/10/18 1527  (1.956 m)  Head Circumference --      Peak Flow --      Pain Score 12/10/18 1526 0     Pain Loc --      Pain Edu? --      Excl. in GC? --    No data found.  Updated Vital Signs BP 131/76 (BP Location: Right Arm)   Pulse (!) 102   Temp (!) 97.5 F (36.4 C) (Tympanic)   Resp 20   Ht 6\' 5"  (1.956 m)   Wt 264 lb (119.7 kg)   SpO2 98%   BMI 31.31 kg/m   Visual Acuity Right Eye Distance:   Left Eye Distance:   Bilateral Distance:    Right Eye Near:   Left Eye Near:    Bilateral Near:     Physical Exam Vitals signs and nursing note reviewed.  Constitutional:      Appearance: Normal appearance. He is well-developed.  HENT:     Head: Normocephalic and atraumatic.     Comments: No facial or oral swelling.    Nose: Nose normal.     Mouth/Throat:     Mouth: Mucous membranes are moist.  Neck:     Musculoskeletal: Normal range of motion.  Cardiovascular:     Rate and Rhythm: Normal rate.  Pulmonary:     Effort: Pulmonary effort is normal. No respiratory distress.     Breath sounds: Normal breath sounds. No stridor.  Musculoskeletal: Normal range of motion.  Skin:    General: Skin is warm and dry.     Findings: Erythema and rash present.     Comments: 2-4cm erythematous macules on back, arms, and sparsely on legs.  Rash is non-tender. Rash does blanch. No discharge.    Neurological:     Mental Status: He is alert and oriented to person, place, and time.  Psychiatric:        Behavior: Behavior normal.      UC Treatments / Results  Labs (all labs ordered are listed, but only abnormal results are displayed) Labs Reviewed - No data to display  EKG None  Radiology No results found.  Procedures Procedures (including critical care time)  Medications Ordered in UC Medications  famotidine (PEPCID) tablet 20 mg (20 mg Oral Given 12/10/18 1531)  methylPREDNISolone sodium succinate (SOLU-MEDROL) 40 mg/mL injection 80 mg (80 mg Intramuscular Given 12/10/18 1531)    Initial Impression / Assessment and Plan / UC Course  I have reviewed the triage vital signs and the nursing notes.  Pertinent labs & imaging results that were available during my care of the patient were reviewed by me and considered in my medical decision making (see chart for details).     Rash c/w allergic reaction Reassured pt, doubt Covid-19 related.   Only recent change for pt was re-starting gabapentin 5 days ago For precaution, will instruct pt to stop taking this medication and to consult with his neurologist about alternative treatment options in case the gabapentin is what is causing the rash.  Discussed symptoms that warrant emergent care in the ED.  Final Clinical Impressions(s) / UC Diagnoses   Final diagnoses:  Urticaria     Discharge Instructions      It will be difficult to determine the cause of your rash, however, due to the most recent change of you re-starting gabapentin, it would be advised that you stop taking this medication for a few days if possible to see if that makes a difference.  If so, you should  talk with your neurologist about alternative medications.    Please follow up with your primary care provider later this week if not improving.  Call 911 or go to the hospital if your rash becomes worse, you develop swelling of your lips, tongue or  throat, you develop abdominal pain, nausea/vomiting, or other new concerning symptoms develop as these are signs of a severe allergic reaction that require immediate medical treatment.     ED Prescriptions    Medication Sig Dispense Auth. Provider   predniSONE (DELTASONE) 50 MG tablet Take 1 tablet (50 mg total) by mouth daily with breakfast for 5 days. 5 tablet Waylan Rocher O, PA-C   cetirizine (ZYRTEC) 10 MG tablet Take 1 tablet (10 mg total) by mouth daily. 30 tablet Waylan Rocher O, PA-C   triamcinolone cream (KENALOG) 0.1 % Apply 1 application topically 2 (two) times daily. 30 g Lurene Shadow, PA-C     Controlled Substance Prescriptions [place] Controlled Substance Registry consulted? Not Applicable   Rolla Plate 12/10/18 1654

## 2018-12-10 NOTE — ED Triage Notes (Signed)
Scratching back last night and though may have a rash, but went to bed.  This am has rash on arms, torso, and starting on legs.

## 2018-12-10 NOTE — Discharge Instructions (Signed)
°  It will be difficult to determine the cause of your rash, however, due to the most recent change of you re-starting gabapentin, it would be advised that you stop taking this medication for a few days if possible to see if that makes a difference.  If so, you should talk with your neurologist about alternative medications.    Please follow up with your primary care provider later this week if not improving.  Call 911 or go to the hospital if your rash becomes worse, you develop swelling of your lips, tongue or throat, you develop abdominal pain, nausea/vomiting, or other new concerning symptoms develop as these are signs of a severe allergic reaction that require immediate medical treatment.

## 2018-12-11 ENCOUNTER — Telehealth: Payer: Self-pay | Admitting: Emergency Medicine

## 2018-12-11 ENCOUNTER — Telehealth: Payer: Self-pay | Admitting: Diagnostic Neuroimaging

## 2018-12-11 NOTE — Telephone Encounter (Signed)
[employer] golden rule auth: NPR Ref # Ricky Foster order sent to GI. They will reach out to the pt to schedule.

## 2018-12-11 NOTE — Telephone Encounter (Signed)
Message left on voice mail inquiring about patient's status and encouraging patient to call with questions/concerns.  

## 2018-12-12 ENCOUNTER — Encounter: Payer: Self-pay | Admitting: Physician Assistant

## 2018-12-12 ENCOUNTER — Ambulatory Visit (INDEPENDENT_AMBULATORY_CARE_PROVIDER_SITE_OTHER): Payer: No Typology Code available for payment source | Admitting: Physician Assistant

## 2018-12-12 DIAGNOSIS — F409 Phobic anxiety disorder, unspecified: Secondary | ICD-10-CM

## 2018-12-12 DIAGNOSIS — F5105 Insomnia due to other mental disorder: Secondary | ICD-10-CM | POA: Diagnosis not present

## 2018-12-12 DIAGNOSIS — F4310 Post-traumatic stress disorder, unspecified: Secondary | ICD-10-CM

## 2018-12-12 MED ORDER — TRAZODONE HCL 50 MG PO TABS: 50.0000 mg | ORAL_TABLET | Freq: Every day | ORAL | 2 refills | Status: DC

## 2018-12-12 NOTE — Progress Notes (Signed)
Virtual Visit via Telephone Note  I connected with Ricky Foster on 12/12/18 at  8:30 AM EDT by telephone and verified that I am speaking with the correct person using two identifiers.   I discussed the limitations, risks, security and privacy concerns of performing an evaluation and management service by telephone and the availability of in person appointments. I also discussed with the patient that there may be a patient responsible charge related to this service. The patient expressed understanding and agreed to proceed.   History of Present Illness: HPI:                                                                Ricky Foster is a 28 y.o. male    12/12/2018 Sleep is improving, nightmares are still intermittent, but he feels Prazosin is helpful Reports it is still taking him awhile to fall asleep, has trouble winding down Half the time he is sleeping 3-4 hours/night and half the time he is getting 5-6 He is taking Trazodone at 12am and getting into bed 12:45-1:00am Overall he feels he is heading in a positive direction   11/09/2018 Sleep improved Having 3-4 nighttime awakenings, improved from 5+ Sleeping 5-6 hrs, still fragmented, but improved from 2-3 Nightmares are no longer nightly    11/02/2018 He states anxiety has improved with Hydroxyzine. He self-discontinued Buspironebecause he did not find it helpful. Reports sleep is still a big issue. Reports 2-3 hours of fragmented sleepnightly with difficulty falling asleep and multiple nighttime awakenings (5+) He is currently taking Prazosin 1 mg and Trazodone 100 mg nightly.  10/05/2018 PTSD/Mood disorder - currently taking Buspar 15 mg three times daily but feels like he has become tolerant to the medication after several years. He also takes Trazodone 100 mg at bedtime, but states this is no longer effective for him either. Reports sleeping is horrible. He is having a lot of nightmares described as "horrific  dreams," waking up crying out and sweating.  Finds himself feeling more anxiousfor no apparent reason.  He has a history of polysubstance abuse (amphetamines, opioids, alcohol) and has been sober for 5 years. He used to follow with Psychiatry (Dr. Gilmore LarocheAkhtar) and was on Seroquel 200 mg for a mood disorder at one time He also used to see a counselor, which he states was helpful, but he had to stop going due to conflicts with his work schedule     Past Medical History:  Diagnosis Date  . Allergic rhinitis   . Anxiety   . Depression   . Hyperuricemia 10/09/2018  . Low back pain   . PTSD (post-traumatic stress disorder)   . Seizure disorder (HCC)   . Syndesmotic disruption of left ankle 05/24/2016   Overview:  Sports Medicine - Dr Jamelle HaringSnow Daws   Past Surgical History:  Procedure Laterality Date  . FRACTURE SURGERY Left    ankle   Social History   Tobacco Use  . Smoking status: Current Every Day Smoker    Packs/day: 0.50    Years: 4.00    Pack years: 2.00    Types: Cigarettes  . Smokeless tobacco: Current User    Types: Chew  Substance Use Topics  . Alcohol use: No    Alcohol/week: 0.0 standard drinks    Comment:  sober since 2015   family history includes Alcohol abuse in his brother and maternal uncle; Autoimmune disease in his mother; Bone cancer in his maternal grandfather; Breast cancer in his maternal aunt; Depression in his maternal aunt; Diabetes in an other family member; Fibromyalgia in his mother; Pancreatic cancer in his paternal grandfather.    ROS: negative except as noted in the HPI  Medications: Current Outpatient Medications  Medication Sig Dispense Refill  . albuterol (PROVENTIL HFA;VENTOLIN HFA) 108 (90 Base) MCG/ACT inhaler Inhale 1-2 puffs into the lungs every 4 (four) hours as needed for wheezing or shortness of breath (bronchospasm). 1 Inhaler 0  . celecoxib (CELEBREX) 100 MG capsule Take 1 capsule (100 mg total) by mouth 2 (two) times daily as needed for  moderate pain. 60 capsule 1  . cetirizine (ZYRTEC) 10 MG tablet Take 1 tablet (10 mg total) by mouth daily. 30 tablet 0  . Cyanocobalamin (B-12) 250 MCG TABS Take 250 mcg by mouth daily. 90 each 3  . DULoxetine HCl 40 MG CPEP Take 1 capsule by mouth 2 (two) times daily. 180 capsule 0  . fluticasone (FLONASE) 50 MCG/ACT nasal spray Place 2 sprays into both nostrils daily. 16 g 6  . gabapentin (NEURONTIN) 300 MG capsule Take 1 capsule (300 mg total) by mouth 3 (three) times daily. 90 capsule 11  . hydrOXYzine (ATARAX/VISTARIL) 25 MG tablet Take 1 tablet (25 mg total) by mouth 3 (three) times daily as needed for anxiety. 60 tablet 0  . prazosin (MINIPRESS) 2 MG capsule Take 1 capsule (2 mg total) by mouth at bedtime. 90 capsule 0  . predniSONE (DELTASONE) 50 MG tablet Take 1 tablet (50 mg total) by mouth daily with breakfast for 5 days. 5 tablet 0  . traZODone (DESYREL) 150 MG tablet Take 1 tablet (150 mg total) by mouth at bedtime. 90 tablet 0  . triamcinolone cream (KENALOG) 0.1 % Apply 1 application topically 2 (two) times daily. 30 g 0  . traZODone (DESYREL) 50 MG tablet Take 1 tablet (50 mg total) by mouth at bedtime. Take in addition to 150 mg tab for TDD of 200 mg 30 tablet 2   No current facility-administered medications for this visit.    No Known Allergies     Objective:  There were no vitals taken for this visit. Neuro: alert and oriented x 3 Psych: cooperative, euthymic mood, affect mood-congruent, speech is articulate, normal rate and volume; thought processes clear and goal-directed, normal judgment, good insight   No results found for this or any previous visit (from the past 72 hour(s)). No results found.    Assessment and Plan: 28 y.o. male with   .Diagnoses and all orders for this visit:  PTSD (post-traumatic stress disorder)  Insomnia due to anxiety and fear  Other orders -     traZODone (DESYREL) 50 MG tablet; Take 1 tablet (50 mg total) by mouth at bedtime.  Take in addition to 150 mg tab for TDD of 200 mg   Increasing Trazodone to 200 mg nightly Cont Prazosin 2 mg nightly Provided with list of EMDR therapists in his network. He will contact to schedule an appointment F/u in 1 month     Observations/Objective:   Assessment and Plan:   Follow Up Instructions:    I discussed the assessment and treatment plan with the patient. The patient was provided an opportunity to ask questions and all were answered. The patient agreed with the plan and demonstrated an understanding of the instructions.  The patient was advised to call back or seek an in-person evaluation if the symptoms worsen or if the condition fails to improve as anticipated.  I provided 5-10 minutes of non-face-to-face time during this encounter.   [name], [place]

## 2019-01-05 ENCOUNTER — Ambulatory Visit: Payer: No Typology Code available for payment source | Admitting: Physician Assistant

## 2019-01-08 ENCOUNTER — Ambulatory Visit: Payer: No Typology Code available for payment source | Admitting: Physician Assistant

## 2019-01-08 ENCOUNTER — Encounter: Payer: Self-pay | Admitting: Physician Assistant

## 2019-01-10 ENCOUNTER — Encounter: Payer: Self-pay | Admitting: Physician Assistant

## 2019-01-11 ENCOUNTER — Encounter: Payer: Self-pay | Admitting: Physician Assistant

## 2019-01-11 ENCOUNTER — Ambulatory Visit (INDEPENDENT_AMBULATORY_CARE_PROVIDER_SITE_OTHER): Payer: No Typology Code available for payment source | Admitting: Physician Assistant

## 2019-01-11 VITALS — Wt 255.0 lb

## 2019-01-11 DIAGNOSIS — Z1331 Encounter for screening for depression: Secondary | ICD-10-CM

## 2019-01-11 DIAGNOSIS — F515 Nightmare disorder: Secondary | ICD-10-CM

## 2019-01-11 DIAGNOSIS — F0630 Mood disorder due to known physiological condition, unspecified: Secondary | ICD-10-CM

## 2019-01-11 MED ORDER — GABAPENTIN 300 MG PO CAPS: ORAL_CAPSULE | ORAL | 11 refills | Status: AC

## 2019-01-11 MED ORDER — TRAZODONE HCL 50 MG PO TABS: ORAL_TABLET | ORAL | 0 refills | Status: AC

## 2019-01-11 MED ORDER — PRAZOSIN HCL 5 MG PO CAPS: 5.0000 mg | ORAL_CAPSULE | Freq: Every day | ORAL | 0 refills | Status: AC

## 2019-01-11 NOTE — Progress Notes (Signed)
Virtual Visit via Telephone Note  I connected with Ricky Foster on 01/11/19 at  8:50 AM EDT by telephone and verified that I am speaking with the correct person using two identifiers.   I discussed the limitations, risks, security and privacy concerns of performing an evaluation and management service by telephone and the availability of in person appointments. I also discussed with the patient that there may be a patient responsible charge related to this service. The patient expressed understanding and agreed to proceed.   History of Present Illness: HPI:                                                                Ricky Foster is a 28 y.o. 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 He feels tired but is unable to wind down Nightmares are unchanged He is taking Gabapentin 300 mg tid for neuropathy. Does not have any drowsiness/sedation with the medication  12/12/2018 Sleep is improving, nightmares are still intermittent, but he feels Prazosin is helpful Reports it is still taking him awhile to fall asleep, has trouble winding down Half the time he is sleeping 3-4 hours/night and half the time he is getting 5-6 He is taking Trazodone at 12am and getting into bed 12:45-1:00am Overall he feels he is heading in a positive direction   11/09/2018 Sleep improved Having 3-4 nighttime awakenings, improved from 5+ Sleeping 5-6 hrs, still fragmented, but improved from 2-3 Nightmares are no longer nightly    11/02/2018 He states anxiety has improved with Hydroxyzine. He self-discontinued Buspironebecause he did not find it helpful. Reports sleep is still a big issue. Reports 2-3 hours of fragmented sleepnightly with difficulty falling asleep and  multiple nighttime awakenings(5+) He is currently taking Prazosin 1 mg and Trazodone 100 mg nightly.  10/05/2018 PTSD/Mood disorder - currently taking Buspar 15 mg three times daily but feels like he has become tolerant to the medication after several years. He also takes Trazodone 100 mg at bedtime, but states this is no longer effective for him either. Reports sleeping is horrible. He is having a lot of nightmares described as "horrific dreams," waking up crying out and sweating.  Finds himself feeling more anxiousfor no apparent reason.  He has a history of polysubstance abuse (amphetamines, opioids, alcohol) and has been sober for 5 years. He used to follow with Psychiatry (Dr. Gilmore LarocheAkhtar) and was on Seroquel 200 mg for a mood disorder at one time He also used to see a counselor, which he states was helpful, but he had to stop going due to conflicts with his work schedule  [facility] 01/11/2019 10/05/2018  Decreased Interest 2 1  Down, Depressed, Hopeless 2 2  PHQ - 2 Score 4 3  Altered sleeping 2 3  Tired, decreased energy 3 3  Change in appetite 3 1  Feeling bad or failure about yourself  0 2  Trouble concentrating 0 0  Moving slowly or fidgety/restless 0 0  Suicidal thoughts 0 0  PHQ-9 Score 12 12  Difficult doing work/chores - Somewhat difficult  Some encounter information is confidential and restricted. Go to Review Flowsheets activity to see all data.    GAD 7 : Generalized Anxiety Score 01/11/2019 10/05/2018  Nervous, Anxious, on Edge 3 1  Control/stop worrying 2 2  Worry too much - different things 2 3  Trouble relaxing 3 3  Restless 2 2  Easily annoyed or irritable 3 2  Afraid - awful might happen 0 1  Total GAD 7 Score 15 14  Anxiety Difficulty - Somewhat difficult      Past Medical History:  Diagnosis Date  . Allergic rhinitis   . Anxiety   . Depression   . Hyperuricemia 10/09/2018  . Low back pain   . PTSD (post-traumatic stress disorder)   .  Seizure disorder ([facility])   . Syndesmotic disruption of left ankle 05/24/2016   Overview:  Sports Medicine - Dr Emogene Morgan Daws   Past Surgical History:  Procedure Laterality Date  . FRACTURE SURGERY Left    ankle   Social History   Tobacco Use  . Smoking status: Current Every Day Smoker    Packs/day: 0.50    Years: 4.00    Pack years: 2.00    Types: Cigarettes  . Smokeless tobacco: Current User    Types: Chew  Substance Use Topics  . Alcohol use: No    Alcohol/week: 0.0 standard drinks    Comment: sober since 2015   family history includes Alcohol abuse in his brother and maternal uncle; Autoimmune disease in his mother; Bone cancer in his maternal grandfather; Breast cancer in his maternal aunt; Depression in his maternal aunt; Diabetes in an other family member; Fibromyalgia in his mother; Pancreatic cancer in his paternal grandfather.    ROS: negative except as noted in the HPI  Medications: Current Outpatient Medications  Medication Sig Dispense Refill  . albuterol (PROVENTIL HFA;VENTOLIN HFA) 108 (90 Base) MCG/ACT inhaler Inhale 1-2 puffs into the lungs every 4 (four) hours as needed for wheezing or shortness of breath (bronchospasm). 1 Inhaler 0  . celecoxib (CELEBREX) 100 MG capsule Take 1 capsule (100 mg total) by mouth 2 (two) times daily as needed for moderate pain. 60 capsule 1  . cetirizine (ZYRTEC) 10 MG tablet Take 1 tablet (10 mg total) by mouth daily. 30 tablet 0  . Cyanocobalamin (B-12) 250 MCG TABS Take 250 mcg by mouth daily. 90 each 3  . DULoxetine HCl 40 MG CPEP Take 1 capsule by mouth 2 (two) times daily. 180 capsule 0  . fluticasone (FLONASE) 50 MCG/ACT nasal spray Place 2 sprays into both nostrils daily. 16 g 6  . gabapentin (NEURONTIN) 300 MG capsule Take 1 capsule (300 mg total) by mouth 3 (three) times daily. 90 capsule 11  . hydrOXYzine (ATARAX/VISTARIL) 25 MG tablet Take 1 tablet (25 mg total) by mouth 3 (three) times daily as needed for anxiety. 60 tablet  0  . prazosin (MINIPRESS) 2 MG capsule Take 1 capsule (2 mg total) by mouth at bedtime. 90 capsule 0  . traZODone (DESYREL) 150 MG tablet Take 1 tablet (150 mg total) by mouth at bedtime. 90 tablet 0  . triamcinolone cream (KENALOG) 0.1 % Apply 1 application topically 2 (two) times daily. 30 g 0  . traZODone (DESYREL) 50 MG tablet Take 1 tablet (50 mg total) by mouth at bedtime. Take in addition to 150 mg tab for TDD of 200 mg (Patient not taking: Reported on 01/11/2019) 30 tablet 2   No current  facility-administered medications for this visit.    No Known Allergies     Objective:  Wt 255 lb (115.7 kg)   BMI 30.24 kg/m  Neuro: alert and oriented x 3 Psych: cooperative, depressed mood, speech is articulate, normal rate and volume; thought processes clear and goal-directed, normal judgment, good insight, no SI/HI   No results found for this or any previous visit (from the past 72 hour(s)). No results found.    Assessment and Plan: 28 y.o. male with   .Ricky Foster was seen today for medication management.  Diagnoses and all orders for this visit:  Mood disorder in conditions classified elsewhere -     traZODone (DESYREL) 50 MG tablet; Take 3 tablets (150 mg total) by mouth at bedtime for 3 days, THEN 2 tablets (100 mg total) at bedtime for 3 days, THEN 1 tablet (50 mg total) at bedtime for 3 days, THEN 0.5 tablets (25 mg total) at bedtime for 3 days. Take in addition to 150 mg tab for TDD of 200 mg. -     prazosin (MINIPRESS) 5 MG capsule; Take 1 capsule (5 mg total) by mouth at bedtime. -     gabapentin (NEURONTIN) 300 MG capsule; 1 capsule twice a day, 2 capsules at bedtime  Nightmare disorder -     prazosin (MINIPRESS) 5 MG capsule; Take 1 capsule (5 mg total) by mouth at bedtime.  [id number], no acute safety issues GAD7-15 Tapering off of Trazodone Increasing nighttime Gabapentin dose to 600 mg Increasing Prazosin to 5 mg QHS - he can double up on his existing capsules for 4 mg   Cont Duloxetine 40 mg bid Consider adding Seroquel QHS if no response Follow-up e-visit in 1 month or sooner if needed    Follow Up Instructions:    I discussed the assessment and treatment plan with the patient. The patient was provided an opportunity to ask questions and all were answered. The patient agreed with the plan and demonstrated an understanding of the instructions.   The patient was advised to call back or seek an in-person evaluation if the symptoms worsen or if the condition fails to improve as anticipated.  I provided 5-10 minutes of non-face-to-face time during this encounter.   Carlis Stableharley Elizabeth Esgar Barnick, [place] JerseyPA-C

## 2019-01-17 ENCOUNTER — Encounter: Payer: Self-pay | Admitting: Physician Assistant

## 2019-01-17 ENCOUNTER — Telehealth: Payer: Self-pay | Admitting: Diagnostic Neuroimaging

## 2019-02-10 ENCOUNTER — Encounter: Payer: Self-pay | Admitting: Physician Assistant

## 2019-02-13 ENCOUNTER — Other Ambulatory Visit: Payer: Self-pay | Admitting: Physician Assistant

## 2019-02-13 DIAGNOSIS — G629 Polyneuropathy, unspecified: Secondary | ICD-10-CM

## 2019-02-13 MED ORDER — DULOXETINE HCL 40 MG PO CPEP: 1.0000 | ORAL_CAPSULE | Freq: Two times a day (BID) | ORAL | 0 refills | Status: AC

## 2019-04-11 ENCOUNTER — Encounter: Payer: Self-pay | Admitting: Physician Assistant

## 2019-05-16 NOTE — Telephone Encounter (Signed)
done

## 2020-04-04 IMAGING — US US THYROID
1 series · 13 of 25 positions shown · non-contrast
Comparison: None.

CLINICAL DATA: Right thyroid nodule.

EXAM:
THYROID ULTRASOUND
TECHNIQUE: Ultrasound examination of the thyroid gland and adjacent soft
tissues was performed.

[Series 1: us thyroid · 0.06mm/px · 13 of 34 slices shown]
[im 1/34]
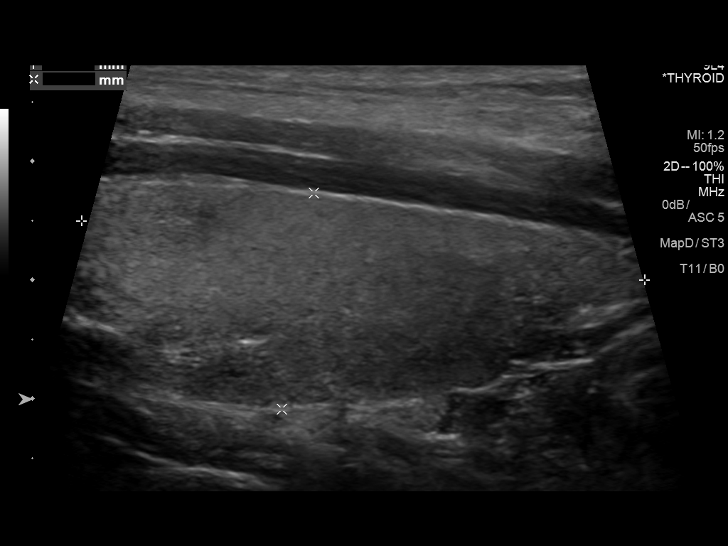
[im 3/34]
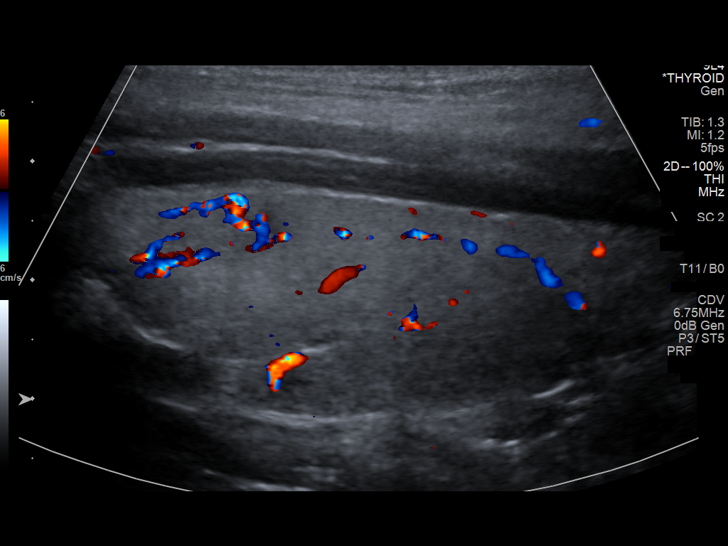
[im 6/34]
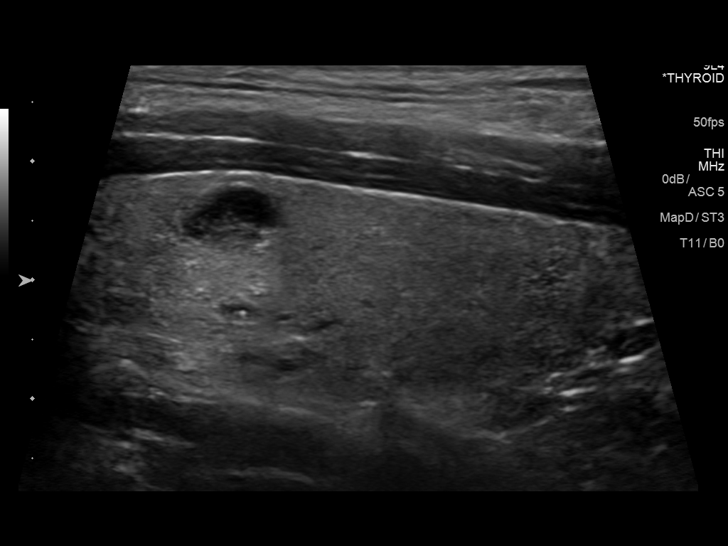
[im 9/34]
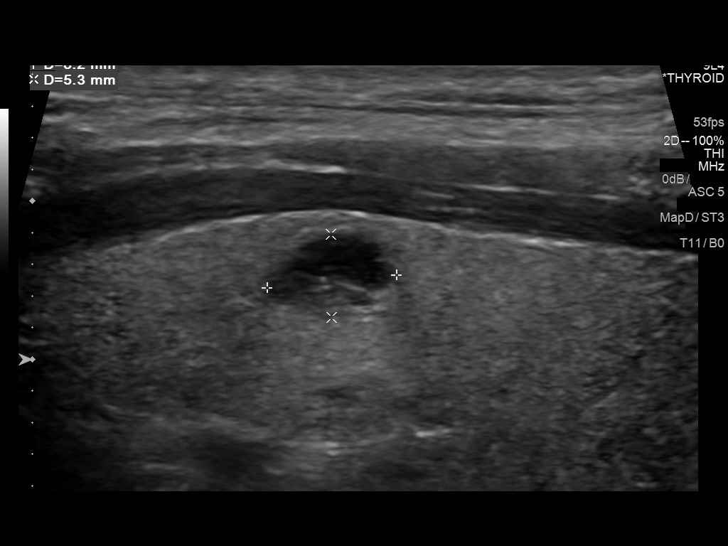
[im 12/34]
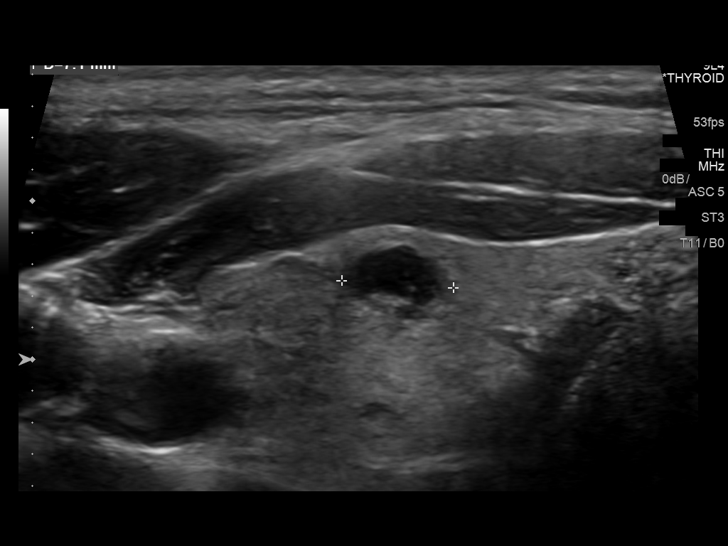
[im 14/34]
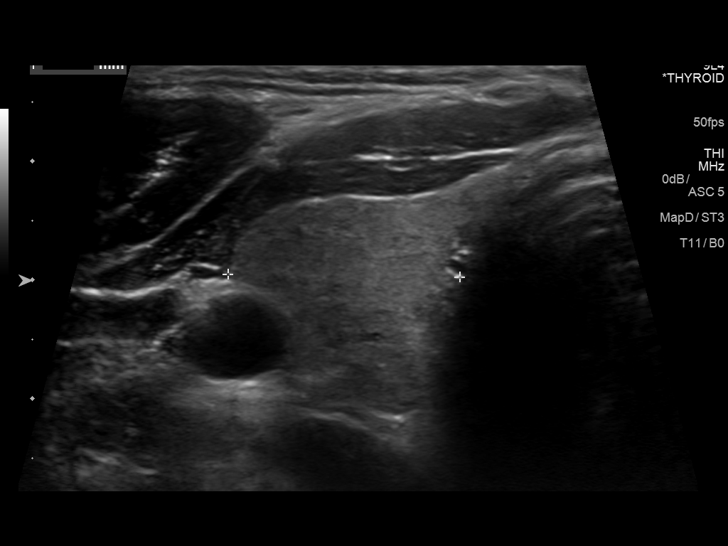
[im 17/34]
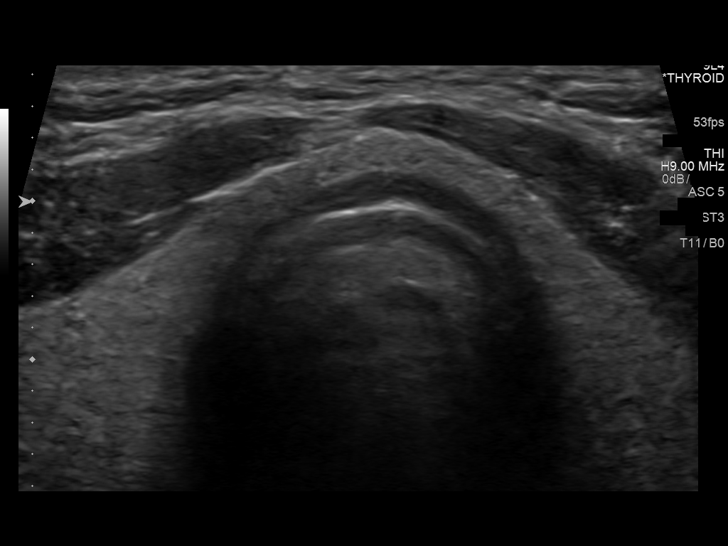
[im 20/34]
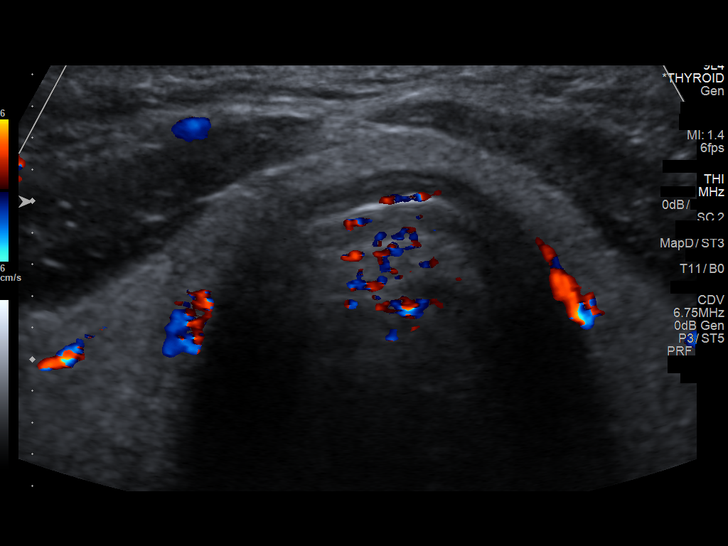
[im 23/34]
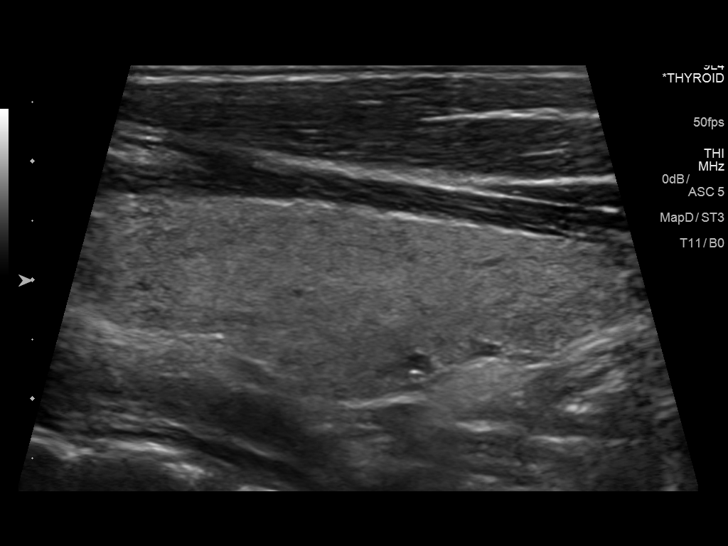
[im 25/34]
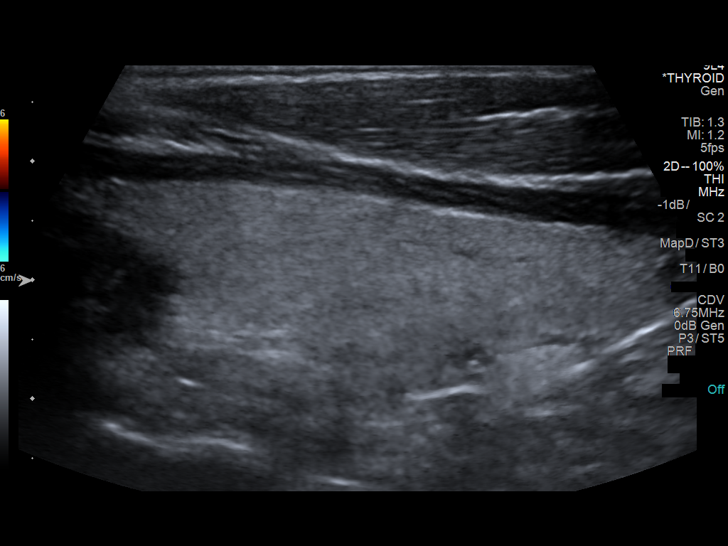
[im 28/34]
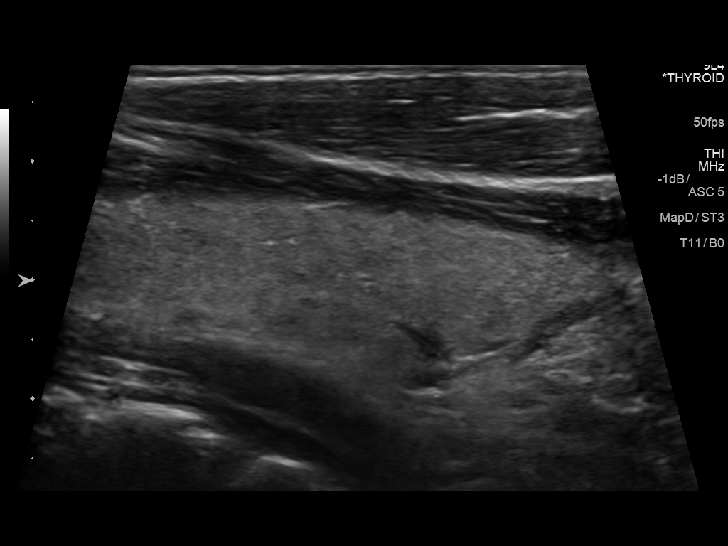
[im 31/34]
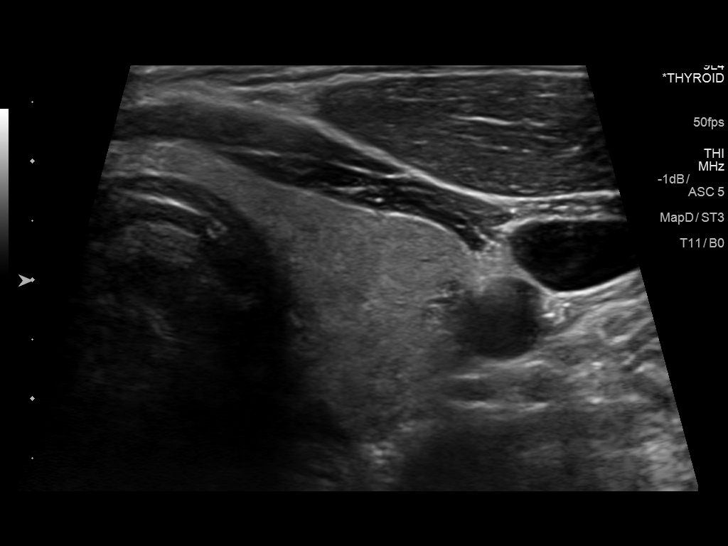
[im 34/34]
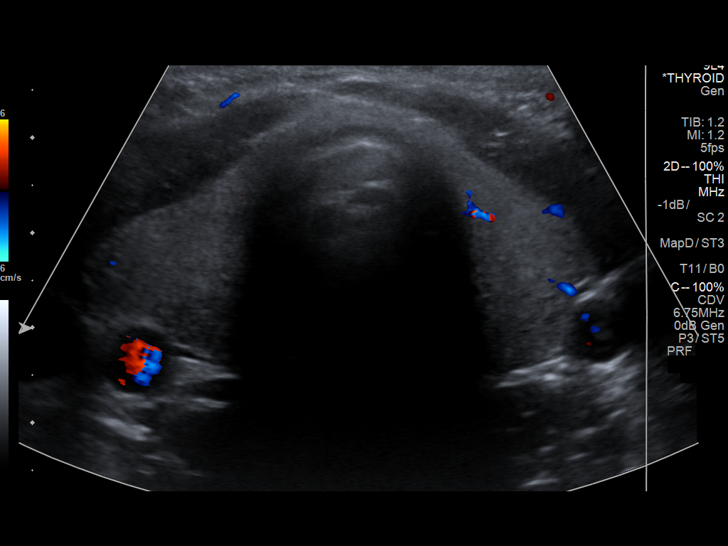

[13 of 25 positions shown; findings below may reference images not displayed]

FINDINGS: Parenchymal Echotexture: Normal

Isthmus: 0.3 cm

Right lobe: 4.8 x 1.8 x 2.0 cm

Left lobe: 4.6 x 1.6 x 1.6 cm

_________________________________________________________

Estimated total number of nodules >/= 1 cm: 0

Number of spongiform nodules >/=  2 cm not described below (TR1): 0

Number of mixed cystic and solid nodules >/= 1.5 cm not described
below (TR2): 0

_________________________________________________________

Nodule # 1:

Location: Right; Mid

Maximum size: 0.8 cm; Other 2 dimensions: 0.5 x 0.7 cm

Composition: cannot determine (2)

Echogenicity: hypoechoic (2)

Shape: not taller-than-wide (0)

Margins: ill-defined (0)

Echogenic foci: none (0)

ACR TI-RADS total points: 4.

ACR TI-RADS risk category: TR4 (4-6 points).

ACR TI-RADS recommendations:

Given size (<0.9 cm) and appearance, this nodule does NOT meet
TI-RADS criteria for biopsy or dedicated follow-up.

_________________________________________________________

No discrete left thyroid nodules.
IMPRESSION: Single right thyroid nodule measuring up to 0.8 cm. The composition
of this nodule is not clear. This nodule does not meet criteria for
dedicated follow-up or biopsy based on TI-RADS but based on
patient's age consider annual surveillance since the composition is
indeterminate.

## 2020-10-01 ENCOUNTER — Telehealth: Payer: Self-pay

## 2020-10-01 NOTE — Telephone Encounter (Signed)
Transition Care Management Unsuccessful Follow-up Telephone Call  Date of discharge and from where:  09/30/2020 from [facility].   Attempts:  1st Attempt  Reason for unsuccessful TCM follow-up call:  Unable to leave message

## 2020-10-02 NOTE — Telephone Encounter (Signed)
Transition Care Management Unsuccessful Follow-up Telephone Call  Date of discharge and from where:  09/30/2020 from [facility]  Attempts:  2nd Attempt  Reason for unsuccessful TCM follow-up call:  Unable to leave message

## 2020-10-03 NOTE — Telephone Encounter (Signed)
Transition Care Management Unsuccessful Follow-up Telephone Call  Date of discharge and from where:  09/30/2020 from [facility]  Attempts:  2nd Attempt  Reason for unsuccessful TCM follow-up call:  Unable to leave message     

## 2021-03-04 ENCOUNTER — Other Ambulatory Visit (HOSPITAL_BASED_OUTPATIENT_CLINIC_OR_DEPARTMENT_OTHER): Payer: Self-pay
